# Patient Record
Sex: Male | Born: 1985 | Race: Black or African American | Hispanic: No | Marital: Married | Smoking: Never smoker
Health system: Southern US, Community
[De-identification: ages and names within clinical notes are randomized; demographics above are authoritative.]

## PROBLEM LIST (undated history)

## (undated) DIAGNOSIS — J302 Other seasonal allergic rhinitis: Secondary | ICD-10-CM

## (undated) DIAGNOSIS — J329 Chronic sinusitis, unspecified: Secondary | ICD-10-CM

## (undated) HISTORY — PX: ACNE CYST REMOVAL: SUR1112

---
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
## 2009-04-07 ENCOUNTER — Encounter: Admission: RE | Admit: 2009-04-07 | Discharge: 2009-04-07 | Payer: Self-pay | Admitting: Family Medicine

## 2009-04-19 ENCOUNTER — Emergency Department (HOSPITAL_COMMUNITY): Admission: EM | Admit: 2009-04-19 | Discharge: 2009-04-19 | Payer: Self-pay | Admitting: Family Medicine

## 2009-04-21 ENCOUNTER — Emergency Department (HOSPITAL_COMMUNITY)
Admission: EM | Admit: 2009-04-21 | Discharge: 2009-04-21 | Payer: Self-pay | Source: Home / Self Care | Admitting: Family Medicine

## 2009-12-25 ENCOUNTER — Emergency Department (HOSPITAL_COMMUNITY)
Admission: EM | Admit: 2009-12-25 | Discharge: 2009-12-25 | Payer: Self-pay | Source: Home / Self Care | Admitting: Emergency Medicine

## 2010-07-24 IMAGING — CR DG HAND COMPLETE 3+V*R*
3 series · 3 of 3 positions shown · non-contrast
Comparison: None.

CLINICAL DATA: Pain and swelling of the third and fourth
metacarpals after an injury.

RIGHT HAND - COMPLETE 3+ VIEW

[view not recorded (1 of 3)]
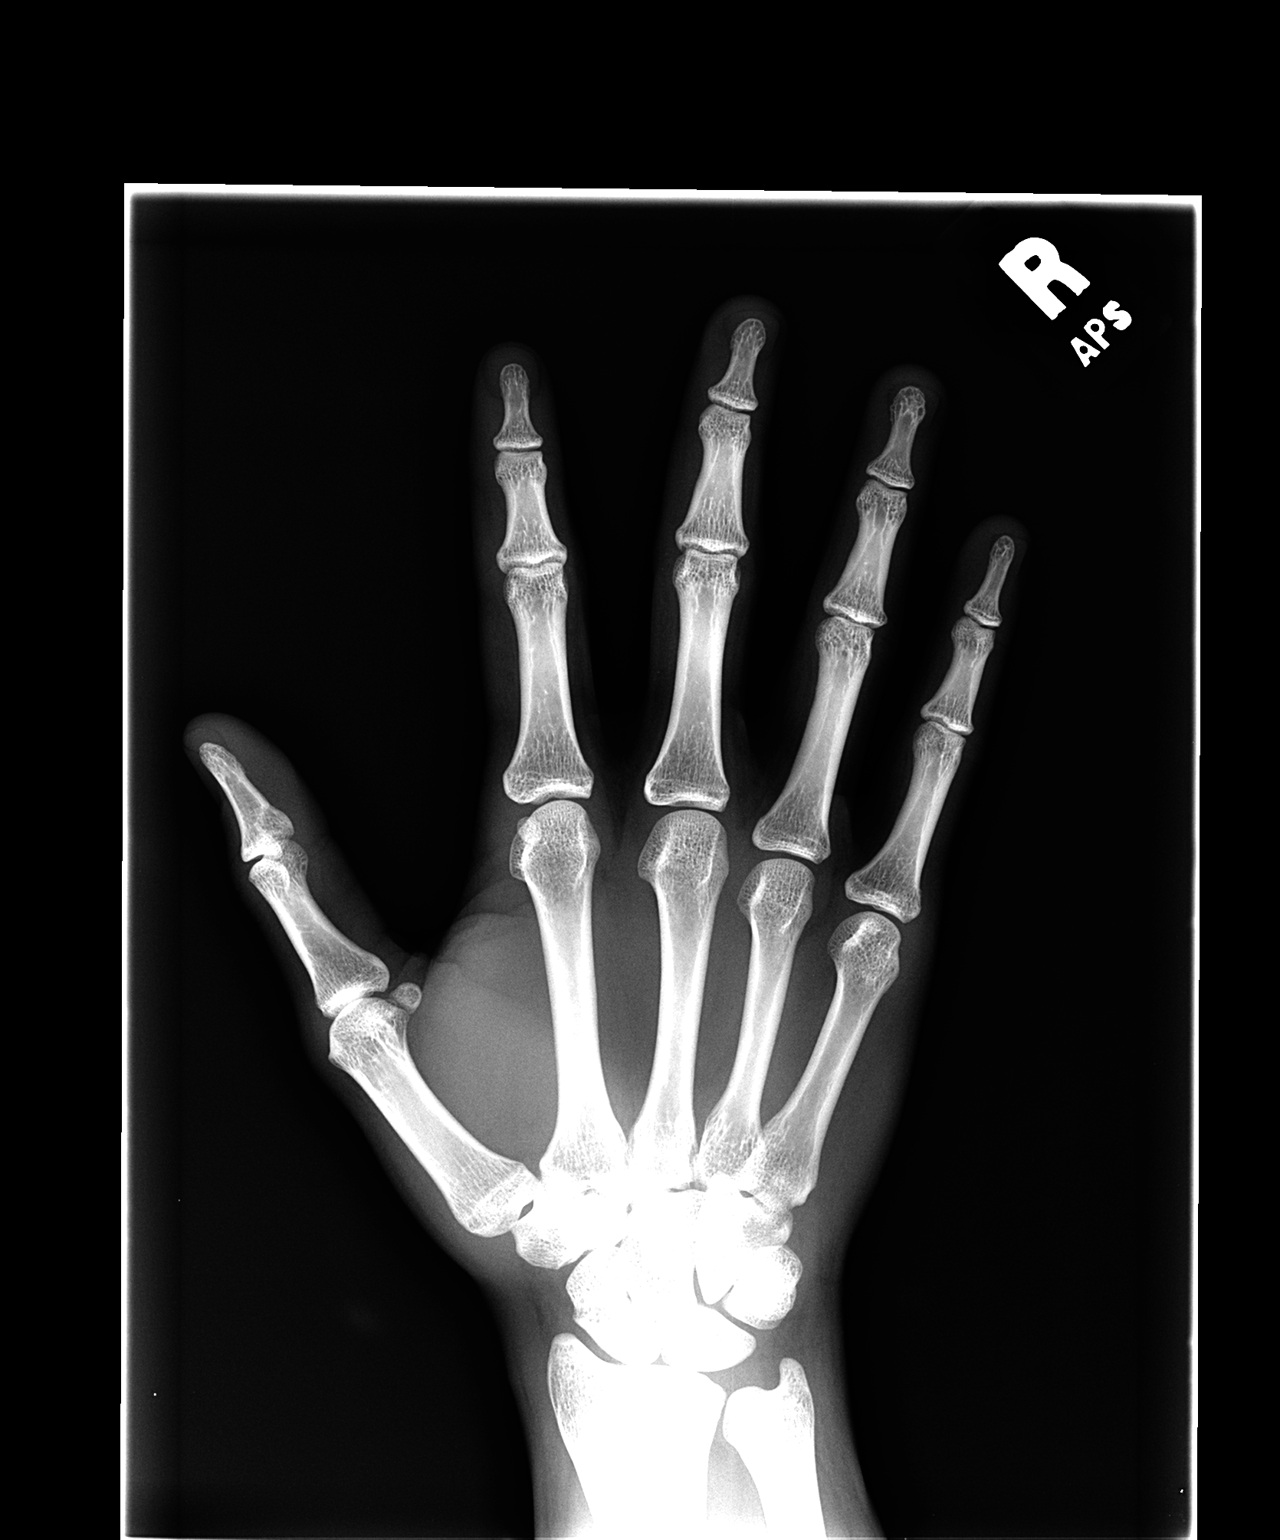

[view not recorded (2 of 3)]
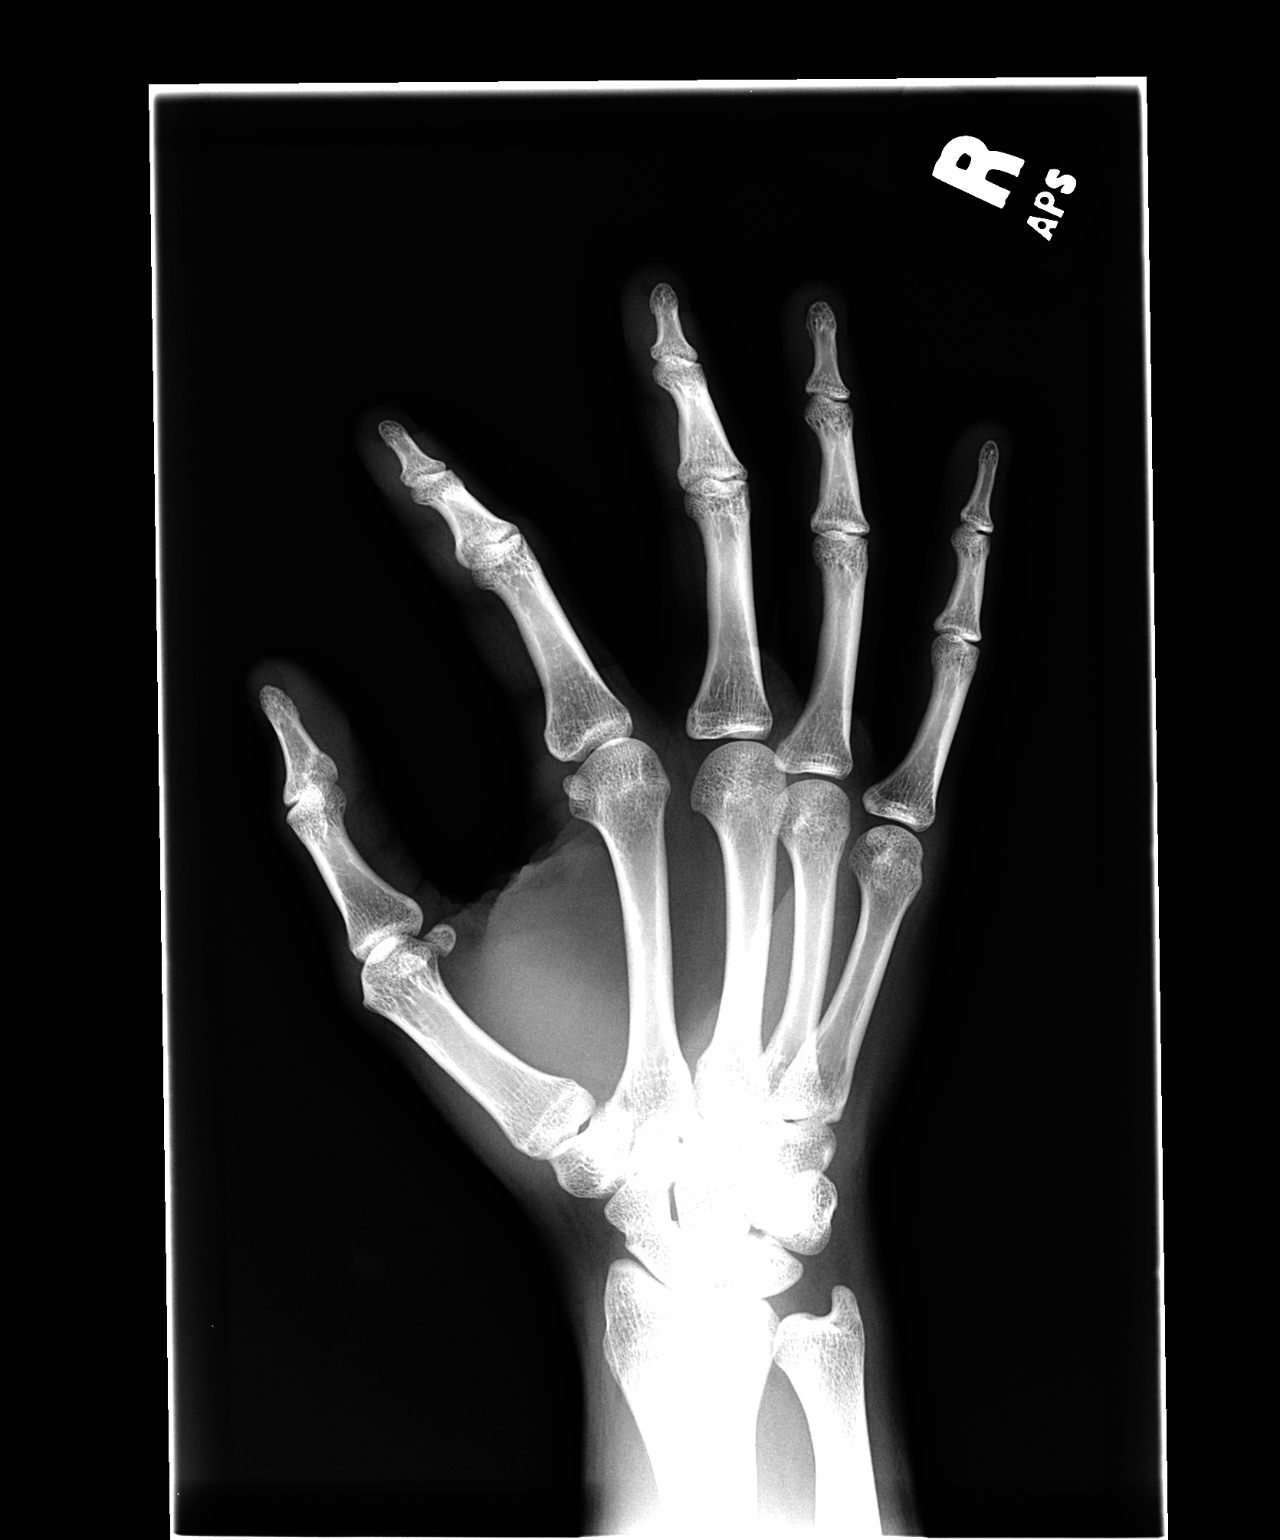

[view not recorded (3 of 3)]
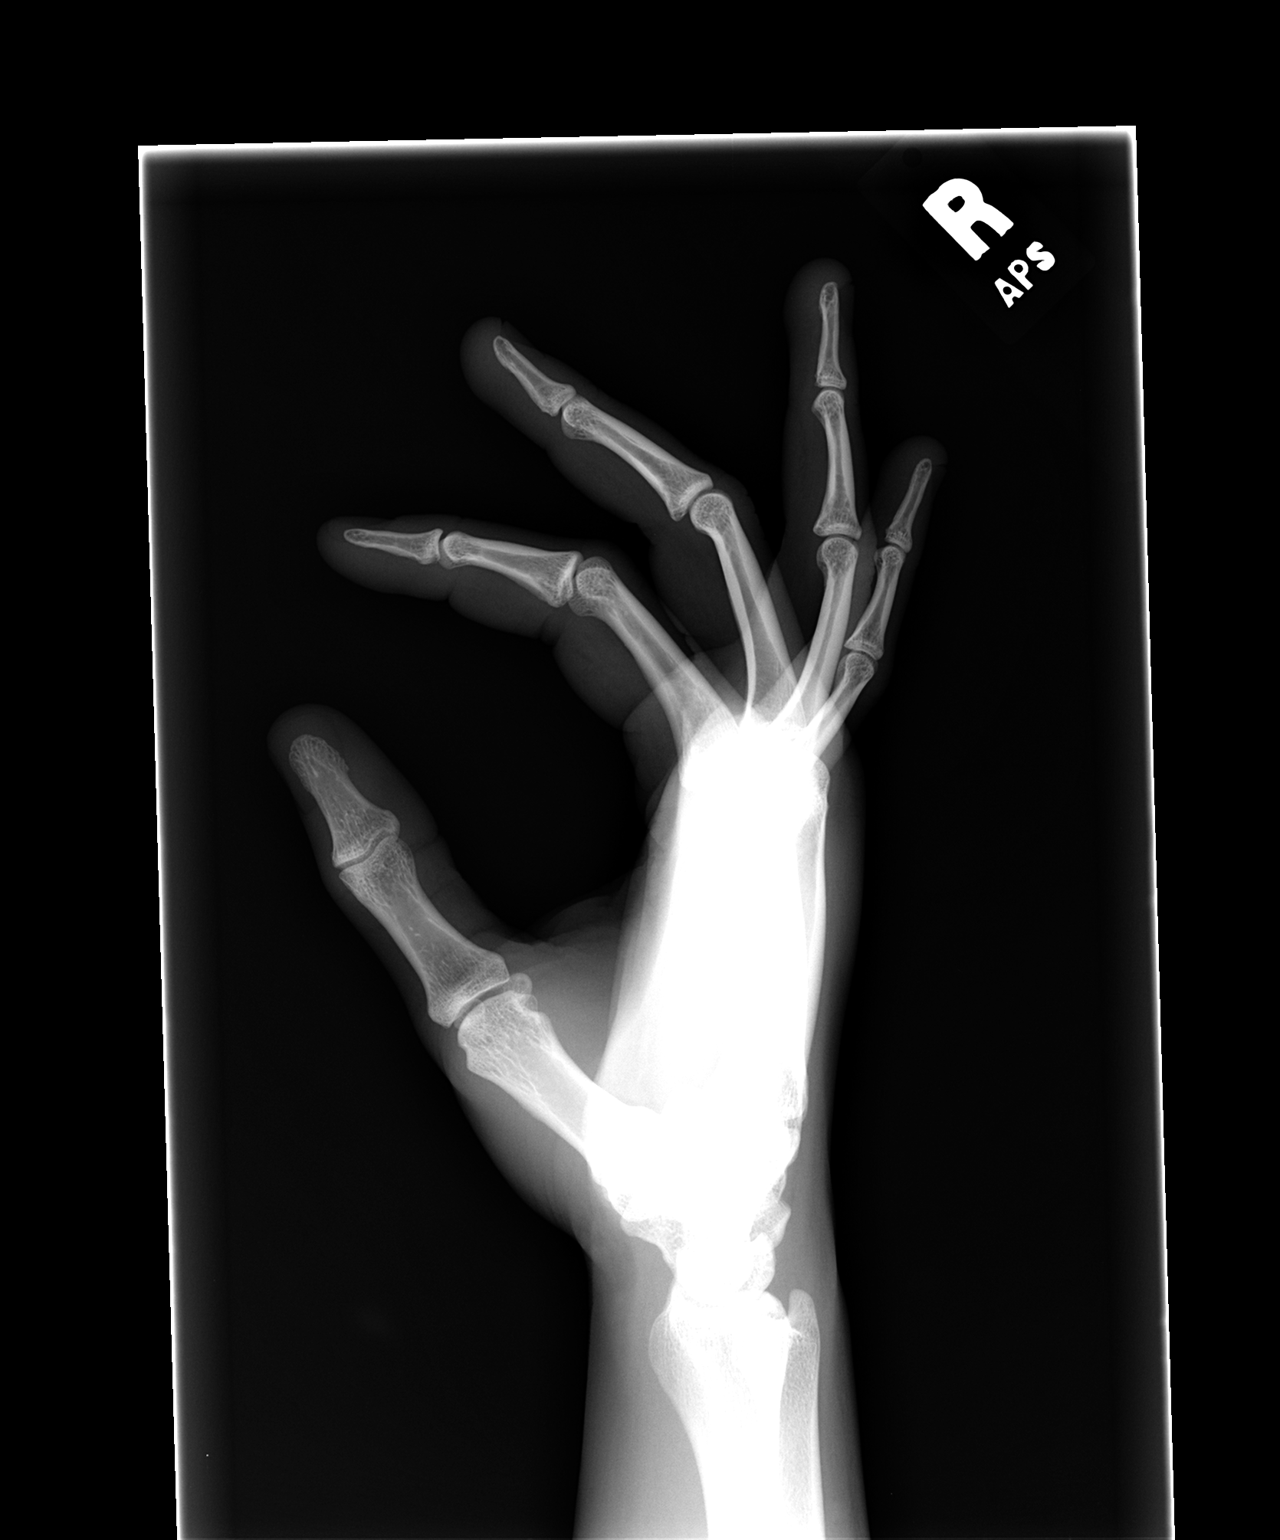

[3 of 3 positions shown; findings below may reference images not displayed]

FINDINGS: Dorsal soft tissue swelling without underlying fracture
or joint abnormality.
IMPRESSION: Dorsal soft tissue swelling without acute fracture or joint
abnormality.

## 2011-08-31 ENCOUNTER — Emergency Department (INDEPENDENT_AMBULATORY_CARE_PROVIDER_SITE_OTHER)
Admission: EM | Admit: 2011-08-31 | Discharge: 2011-08-31 | Disposition: A | Discharge status: Home / Self Care | Payer: Self-pay | Source: Home / Self Care | Attending: Emergency Medicine | Admitting: Emergency Medicine

## 2011-08-31 ENCOUNTER — Encounter (HOSPITAL_COMMUNITY): Payer: Self-pay | Admitting: *Deleted

## 2011-08-31 DIAGNOSIS — J029 Acute pharyngitis, unspecified: Secondary | ICD-10-CM

## 2011-08-31 HISTORY — DX: Other seasonal allergic rhinitis: J30.2

## 2011-08-31 HISTORY — DX: Chronic sinusitis, unspecified: J32.9

## 2011-08-31 LAB — POCT RAPID STREP A: Streptococcus, Group A Screen (Direct): NEGATIVE

## 2011-08-31 MED ORDER — FLUTICASONE PROPIONATE 50 MCG/ACT NA SUSP: 2.0000 | Freq: Every day | NASAL | Status: AC

## 2011-08-31 MED ORDER — IBUPROFEN 600 MG PO TABS: 600.0000 mg | ORAL_TABLET | Freq: Four times a day (QID) | ORAL | Status: AC | PRN

## 2011-08-31 NOTE — ED Notes (Signed)
Pt is here with complaints of sore throat X 5 days.  States he did have body aches and possibly a fever but these have resolved.  Bumps noted on tongue.

## 2011-08-31 NOTE — ED Provider Notes (Signed)
History     CSN: [id number]  Arrival date & time 08/31/11  1102   First MD Initiated Contact with Patient 08/31/11 1102      Chief Complaint  Patient presents with  . Sore Throat    (Consider location/radiation/quality/duration/timing/severity/associated sxs/prior treatment) HPI Comments: Pt with rhinorrhea, postnasal drip, ST x 4 days. States was worse this morning, but also states that he slept with his mouth open, as he was having difficulty breathing through his nose last night Reports feeling feverish and with bodyaches first day, but states that this has since resolved. No documented fevers at home. No coughing, fatigue, body aches. No ear pain, abd pain, wheeze, SOB, abd pain, rash, N/V. Appetite unchanged. Taking Alka-Seltzer cold medication with some relief.  No recent strep exposure, abdominal pain, rash reflux symptoms.   Patient is a 26 y.o. male presenting with pharyngitis. The history is provided by the patient. No language interpreter was used.  Sore Throat This is a new problem. The current episode started more than 2 days ago. The problem occurs constantly. The problem has not changed since onset.Pertinent negatives include no abdominal pain and no headaches. The symptoms are aggravated by swallowing. The symptoms are relieved by medications. Treatments tried: Alka-Seltzer cold. The treatment provided mild relief.    Past Medical History  Diagnosis Date  . Sinusitis   . Seasonal allergies     History reviewed. No pertinent past surgical history.  History reviewed. No pertinent family history.  History  Substance Use Topics  . Smoking status: Never Smoker   . Smokeless tobacco: Not on file  . Alcohol Use: Yes      Review of Systems  Gastrointestinal: Negative for abdominal pain.  Neurological: Negative for headaches.    Allergies  Hydrocodone  Home Medications   Current Outpatient Rx  Name Route Sig Dispense Refill  . FLUTICASONE PROPIONATE 50  MCG/ACT NA SUSP Nasal Place 2 sprays into the nose daily. 16 g 0  . IBUPROFEN 600 MG PO TABS Oral Take 1 tablet (600 mg total) by mouth every 6 (six) hours as needed for pain. 30 tablet 0    BP 151/66  Pulse 78  Temp 98.2 F (36.8 C) (Oral)  Resp 14  SpO2 96%  Physical Exam  Nursing note and vitals reviewed. Constitutional: He is oriented to person, place, and time. He appears well-developed and well-nourished.  HENT:  Head: Normocephalic and atraumatic.  Right Ear: Hearing, tympanic membrane and ear canal normal.  Left Ear: Hearing, tympanic membrane and ear canal normal.  Nose: Rhinorrhea present. No epistaxis.  Mouth/Throat: Uvula is midline and mucous membranes are normal. Posterior oropharyngeal erythema present. No oropharyngeal exudate.       Cobblestoned oropharynx. Tonsils normal No sinus tenderness  Eyes: Conjunctivae and EOM are normal.  Neck: Normal range of motion. Neck supple.  Cardiovascular: Normal rate, regular rhythm and normal heart sounds.   Pulmonary/Chest: Effort normal and breath sounds normal. No respiratory distress.  Abdominal: Soft. Bowel sounds are normal. He exhibits no distension. There is no tenderness.  Musculoskeletal: Normal range of motion. He exhibits no edema and no tenderness.  Lymphadenopathy:    He has no cervical adenopathy.  Neurological: He is alert and oriented to person, place, and time.  Skin: Skin is warm and dry. No rash noted.  Psychiatric: He has a normal mood and affect. His behavior is normal. Judgment and thought content normal.    ED Course  Procedures (including critical care time)  Labs Reviewed  POCT RAPID STREP A ([facility] ONLY)   No results found.   1. Pharyngitis     Rapid strep negative  MDM  H&P most consistent with post uri ST/ postnasal drip will send home with Flonase, saline nasal irrigation, decongestions, ibuprofen as needed for symptoms. Will provide a list of primary care resources for followup  as needed. Patient agrees with plan.  Luiz Blare, MD 08/31/11 1229

## 2011-08-31 NOTE — Discharge Instructions (Signed)
Your strep test was negative. The symptoms are most likely from postnasal drip . Start doing saline nasal irrigation with no med sinus rinse, Neti pot, and Flonase, and started decongestants such as Sudafed with nasal congestion. Your sore throat should improve after this. You may take ibuprofen 600 mg and Tylenol 1 g up to 4 times a day as needed for pain. Go to www.goodrx.com to look up your medications. This will give you a list of where you can find your prescriptions at the most affordable prices.   Call [facility]  [phone]  If you have no primary doctor, here are some resources that may be helpful:  Medicaid-accepting [facility] Providers:   - [facility]- [address]      [phone]      Mon-Fri 9am-7pm, Sat 9am-1pm   - [facility]- [address] [place]      [phone]   - [facility]- [address] [place]      [phone]   [facility]- [address]      [phone]   - Renaye Rakers- [address] [place], [address]      [phone]      Only accepts [place] patients       after they have her name applied to their card   Self Pay (no insurance) in [place]:   - Sickle Cell Patients: Dr Willey Blade, [facility]      [address] [place]      [phone]   - [phone]   - Physician Referral Service- [phone]   - [facility]- [address] [place]      [phone]   [facility]- [address]   - [facility]- see information above      (Speak to [employer] if you do not have insurance)   - [facility]- [address] [place]      [phone]   - [facility] [place]- [address]      [phone]   - [facility]- [address]      [phone]   - Dr Julio Sicks-  [address], [place]      [phone]   - [facility]- [address]      [phone]   - [facility]- [address]      [phone]      Also [address]      [phone]   - [facility]- [address]      [phone]      1st and 3rd Saturday every month, 10am-1pm    Other agencies that provide inexpensive medical care:     [facility]  [phone]    [facility]  [phone]    [facility]  [phone]    [facility]  [phone] [address] [place] [address]    [facility]  [phone]    [facility]  [phone] [facility] [phone]   [address] [place], [place] [address]  Chronic Pain Problems Contact [facility]  [phone] Patients need to be referred by their primary care doctor.  [employer]  [employer]  [facility]     [employer]                          [facility] [address] [place]                       [address]      [address] [place] [address]   [phone] (After Hours)  General Information: Finding a doctor when you do not have health insurance can be tricky. Although you are not limited by an insurance plan, you are of course limited by her finances and how much but he can pay out of pocket.  What are your options if you don't have health insurance?   1) Find a Librarian, academic and Pay Out of Pocket Although you won't have to find out who is covered by your insurance plan, it is a good idea to ask around and get recommendations. You will then need to call the office and see if the doctor you have chosen will accept you as a new patient and what types of options they offer for patients who are self-pay. Some doctors offer discounts or will set up payment plans for their patients who do not have insurance, but you will need to ask so you aren't surprised when you get to your appointment.  2) Contact Your Local  Health Department Not all health departments have doctors that can see patients for sick visits, but many do, so it is worth a call to see if yours does. If you don't know where your local health department is, you can check in your phone book. The CDC also has a tool to help you locate your state's health department, and many state websites also have listings of all of their local health departments.  3) Find a Walk-in Clinic If your illness is not likely to be very severe or complicated, you may want to try a walk in clinic. These are popping up all over the country in pharmacies, drugstores, and shopping centers. They're usually staffed by nurse practitioners or physician assistants that have been trained to treat common illnesses and complaints. They're usually fairly quick and inexpensive. However, if you have serious medical issues or chronic medical problems, these are probably not your best option

## 2011-08-31 NOTE — ED Notes (Signed)
Unable to discharge pt due to pending strep screen.

## 2015-04-25 ENCOUNTER — Encounter: Payer: Self-pay | Admitting: Gynecology

## 2015-04-25 ENCOUNTER — Ambulatory Visit
Admission: EM | Admit: 2015-04-25 | Discharge: 2015-04-25 | Disposition: A | Discharge status: Home / Self Care | Payer: Managed Care, Other (non HMO) | Attending: Family Medicine | Admitting: Family Medicine

## 2015-04-25 DIAGNOSIS — J0100 Acute maxillary sinusitis, unspecified: Secondary | ICD-10-CM

## 2015-04-25 LAB — RAPID STREP SCREEN (MED CTR MEBANE ONLY): Streptococcus, Group A Screen (Direct): NEGATIVE

## 2015-04-25 MED ORDER — AMOXICILLIN 875 MG PO TABS: 875.0000 mg | ORAL_TABLET | Freq: Two times a day (BID) | ORAL | Status: DC

## 2015-04-25 NOTE — ED Provider Notes (Signed)
CSN: [id number]     Arrival date & time 04/25/15  6644 History   First MD Initiated Contact with Patient 04/25/15 1033     Chief Complaint  Patient presents with  . Sore Throat  . URI   (Consider location/radiation/quality/duration/timing/severity/associated sxs/prior Treatment) Patient is a 30 y.o. male presenting with URI. The history is provided by the patient.  URI Presenting symptoms: congestion, cough, facial pain, fatigue, fever, rhinorrhea and sore throat   Severity:  Moderate Onset quality:  Sudden Timing:  Constant Chronicity:  New Relieved by:  Nothing Ineffective treatments:  OTC medications Associated symptoms: headaches and sinus pain   Associated symptoms: no neck pain and no wheezing   Risk factors: sick contacts   Risk factors: not elderly, no chronic cardiac disease, no chronic kidney disease, no chronic respiratory disease, no diabetes mellitus, no immunosuppression, no recent illness and no recent travel     Past Medical History  Diagnosis Date  . Sinusitis   . Seasonal allergies    History reviewed. No pertinent past surgical history. No family history on file. Social History  Substance Use Topics  . Smoking status: Never Smoker   . Smokeless tobacco: None  . Alcohol Use: Yes    Review of Systems  Constitutional: Positive for fever and fatigue.  HENT: Positive for congestion, rhinorrhea and sore throat.   Respiratory: Positive for cough. Negative for wheezing.   Musculoskeletal: Negative for neck pain.  Neurological: Positive for headaches.    Allergies  Hydrocodone  Home Medications   Prior to Admission medications   Medication Sig Start Date End Date Taking? Authorizing Provider  amoxicillin (AMOXIL) 875 MG tablet Take 1 tablet (875 mg total) by mouth 2 (two) times daily. 04/25/15   Payton Mccallum, MD  fluticasone (FLONASE) 50 MCG/ACT nasal spray Place 2 sprays into the nose daily. 08/31/11 08/30/12  Domenick Gong, MD   Meds Ordered and  Administered this Visit  Medications - No data to display  BP 140/81 mmHg  Pulse 64  Temp(Src) 97.8 F (36.6 C) (Oral)  Resp 16  Ht  (1.88 m)  Wt 260 lb (117.935 kg)  BMI 33.37 kg/m2  SpO2 100% No data found.   Physical Exam  Constitutional: He appears well-developed and well-nourished. No distress.  HENT:  Head: Normocephalic and atraumatic.  Right Ear: Tympanic membrane, external ear and ear canal normal.  Left Ear: Tympanic membrane, external ear and ear canal normal.  Nose: Right sinus exhibits maxillary sinus tenderness and frontal sinus tenderness. Left sinus exhibits maxillary sinus tenderness and frontal sinus tenderness.  Mouth/Throat: Uvula is midline, oropharynx is clear and moist and mucous membranes are normal. No oropharyngeal exudate or tonsillar abscesses.  Eyes: Conjunctivae and EOM are normal. Pupils are equal, round, and reactive to light. Right eye exhibits no discharge. Left eye exhibits no discharge. No scleral icterus.  Neck: Normal range of motion. Neck supple. No tracheal deviation present. No thyromegaly present.  Cardiovascular: Normal rate, regular rhythm and normal heart sounds.   Pulmonary/Chest: Effort normal and breath sounds normal. No stridor. No respiratory distress. He has no wheezes. He has no rales. He exhibits no tenderness.  Lymphadenopathy:    He has no cervical adenopathy.  Neurological: He is alert.  Skin: Skin is warm and dry. No rash noted. He is not diaphoretic.  Nursing note and vitals reviewed.   ED Course  Procedures (including critical care time)  Labs Review Labs Reviewed  RAPID STREP SCREEN (NOT AT [facility])  CULTURE,  GROUP A STREP [facility])    Imaging Review No results found.   Visual Acuity Review  Right Eye Distance:   Left Eye Distance:   Bilateral Distance:    Right Eye Near:   Left Eye Near:    Bilateral Near:         MDM   1. Acute maxillary sinusitis, recurrence not specified    Discharge  Medication List as of 04/25/2015 10:46 AM    START taking these medications   Details  amoxicillin (AMOXIL) 875 MG tablet Take 1 tablet (875 mg total) by mouth 2 (two) times daily., Starting 04/25/2015, Until Discontinued, Normal       1. diagnosis reviewed with patient 2. rx as per orders above; reviewed possible side effects, interactions, risks and benefits  3. Recommend supportive treatment with otc flonase 4. Follow-up prn if symptoms worsen or don't improve     Payton Mccallum, MD 04/25/15 1058

## 2015-04-25 NOTE — ED Notes (Signed)
Patient c/o sore throat / head aces / sinus pressure / congestion x 1 week.

## 2015-04-30 LAB — CULTURE, GROUP A STREP (THRC)

## 2015-06-02 ENCOUNTER — Encounter: Payer: Self-pay | Admitting: Emergency Medicine

## 2015-06-02 ENCOUNTER — Ambulatory Visit
Admission: EM | Admit: 2015-06-02 | Discharge: 2015-06-02 | Disposition: A | Discharge status: Home / Self Care | Payer: Managed Care, Other (non HMO) | Attending: Family Medicine | Admitting: Family Medicine

## 2015-06-02 DIAGNOSIS — S29011A Strain of muscle and tendon of front wall of thorax, initial encounter: Secondary | ICD-10-CM | POA: Diagnosis not present

## 2015-06-02 DIAGNOSIS — H6593 Unspecified nonsuppurative otitis media, bilateral: Secondary | ICD-10-CM | POA: Diagnosis not present

## 2015-06-02 DIAGNOSIS — S46911A Strain of unspecified muscle, fascia and tendon at shoulder and upper arm level, right arm, initial encounter: Secondary | ICD-10-CM | POA: Diagnosis not present

## 2015-06-02 DIAGNOSIS — J0101 Acute recurrent maxillary sinusitis: Secondary | ICD-10-CM

## 2015-06-02 MED ORDER — SALINE SPRAY 0.65 % NA SOLN: 2.0000 | NASAL | Status: DC

## 2015-06-02 MED ORDER — AMOXICILLIN-POT CLAVULANATE 875-125 MG PO TABS: 1.0000 | ORAL_TABLET | Freq: Two times a day (BID) | ORAL | Status: DC

## 2015-06-02 MED ORDER — LORATADINE 10 MG PO TBDP: 10.0000 mg | ORAL_TABLET | Freq: Every day | ORAL | Status: AC

## 2015-06-02 MED ORDER — NAPROXEN 500 MG PO TABS
500.0000 mg | ORAL_TABLET | Freq: Two times a day (BID) | ORAL | Status: DC
Start: 2015-06-02 — End: 2017-01-08

## 2015-06-02 NOTE — ED Provider Notes (Signed)
CSN: [id number]     Arrival date & time 06/02/15  1516 History   First MD Initiated Contact with Patient 06/02/15 1641     Chief Complaint  Patient presents with  . 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  Had sinus infection treated with augmentin in the past month resolved but feeling some sinus congestion again.  Right hand dominant.  New father carrying car seat with child up three flights of stairs daily.  Denied known injury/trauma or previous injury to shoulder.  PMHx seasonal allergies  Denied PSHx  FHx Grandfather prostate cancer  The history is provided by the patient.    Past Medical History  Diagnosis Date  . Sinusitis   . Seasonal allergies    Past Surgical History  Procedure Laterality Date  . Acne cyst removal      from jaw   No family history on file. Social History  Substance Use Topics  . Smoking status: Never Smoker   . Smokeless tobacco: None  . Alcohol Use: Yes    Review of Systems  Constitutional: Negative for fever, chills, diaphoresis, activity change, appetite change, fatigue and unexpected weight change.  HENT: Positive for congestion, postnasal drip and sinus pressure. Negative for dental problem, drooling, ear discharge, ear pain, facial swelling, hearing loss, mouth sores, nosebleeds, rhinorrhea, sneezing, sore throat, tinnitus, trouble swallowing and voice change.   Eyes: Negative for photophobia, pain, discharge, redness, itching and visual disturbance.  Respiratory: Negative for cough, choking, chest tightness, shortness of breath, wheezing and stridor.   Cardiovascular: Negative for chest pain, palpitations and leg swelling.  Gastrointestinal: Negative for nausea, vomiting, abdominal pain, diarrhea, constipation,  blood in stool and abdominal distention.  Endocrine: Negative for cold intolerance and heat intolerance.  Genitourinary: Negative for dysuria.  Musculoskeletal: Positive for myalgias. Negative for back pain, joint swelling, arthralgias, gait problem, neck pain and neck stiffness.  Skin: Negative for color change, pallor, rash and wound.  Allergic/Immunologic: Positive for environmental allergies. Negative for food allergies and immunocompromised state.  Neurological: Positive for headaches. Negative for dizziness, tremors, seizures, syncope, facial asymmetry, speech difficulty, weakness, light-headedness and numbness.  Hematological: Negative for adenopathy. Does not bruise/bleed easily.  Psychiatric/Behavioral: Negative for behavioral problems, confusion, sleep disturbance and agitation.    Allergies  Hydrocodone  Home Medications   Prior to Admission medications   Medication Sig Start Date End Date Taking? Authorizing Provider  amoxicillin-clavulanate (AUGMENTIN) 875-125 MG tablet Take 1 tablet by mouth every 12 (twelve) hours. 06/02/15   Barbaraann Barthel, NP  fluticasone (FLONASE) 50 MCG/ACT nasal spray Place 2 sprays into the nose daily. 08/31/11 08/30/12  Domenick Gong, MD  loratadine (CLARITIN REDITABS) 10 MG dissolvable tablet Take 1 tablet (10 mg total) by mouth daily. 06/02/15   Barbaraann Barthel, NP  naproxen (NAPROSYN) 500 MG tablet Take 1 tablet (500 mg total) by mouth 2 (two) times daily with a meal. 06/02/15   Barbaraann Barthel, NP  sodium chloride (OCEAN) 0.65 % SOLN nasal spray Place 2 sprays into both nostrils every 2 (two) hours while awake. 06/02/15 07/03/15  Barbaraann Barthel, NP   Meds Ordered and Administered this Visit  Medications - No data to display  BP 140/69 mmHg  Pulse 72  Temp(Src) 98 F (36.7 C) (Tympanic)  Resp 18  Ht 6\' 2"  (1.88 m)  Wt  260 lb (117.935 kg)  BMI 33.37 kg/m2  SpO2 100% No data found.   Physical Exam  Constitutional: He is oriented to  person, place, and time. Vital signs are normal. He appears well-developed and well-nourished. He is active and cooperative.  Non-toxic appearance. He does not have a sickly appearance. He does not appear ill. No distress.  HENT:  Head: Normocephalic and atraumatic.  Right Ear: Hearing, external ear and ear canal normal. A middle ear effusion is present.  Left Ear: Hearing, external ear and ear canal normal. A middle ear effusion is present.  Nose: Mucosal edema and rhinorrhea present. No nose lacerations, sinus tenderness, nasal deformity, septal deviation or nasal septal hematoma. No epistaxis.  No foreign bodies. Right sinus exhibits maxillary sinus tenderness. Right sinus exhibits no frontal sinus tenderness. Left sinus exhibits maxillary sinus tenderness. Left sinus exhibits no frontal sinus tenderness.  Mouth/Throat: Uvula is midline and mucous membranes are normal. Mucous membranes are not pale, not dry and not cyanotic. He does not have dentures. No oral lesions. No trismus in the jaw. Normal dentition. No dental abscesses, uvula swelling, lacerations or dental caries. Posterior oropharyngeal edema and posterior oropharyngeal erythema present. No oropharyngeal exudate or tonsillar abscesses.  Cobblestoning posterior pharynx; bilateral TMs with air fluid level slight opacity vasculature inflamed; bilateral nasal turbinates with edema/erythema clear discharge  Eyes: EOM and lids are normal. Pupils are equal, round, and reactive to light. Right eye exhibits no chemosis, no discharge, no exudate and no hordeolum. No foreign body present in the right eye. Left eye exhibits no chemosis, no discharge, no exudate and no hordeolum. No foreign body present in the left eye. Right conjunctiva is injected. Right conjunctiva has no hemorrhage. Left conjunctiva is injected. Left conjunctiva has no hemorrhage. No scleral icterus. Right eye exhibits normal extraocular motion and no nystagmus. Left eye exhibits normal  extraocular motion and no nystagmus. Right pupil is round and reactive. Left pupil is round and reactive. Pupils are equal.  1+ bilateral bulbar and eyelid injection  Neck: Trachea normal and normal range of motion. Neck supple. No tracheal tenderness, no spinous process tenderness and no muscular tenderness present. No rigidity. No tracheal deviation, no edema, no erythema and normal range of motion present. No thyroid mass and no thyromegaly present.  Cardiovascular: Normal rate, regular rhythm, S1 normal, S2 normal, normal heart sounds and intact distal pulses.  PMI is not displaced.  Exam reveals no gallop and no friction rub.   No murmur heard. Pulses:      Radial pulses are 2+ on the right side, and 2+ on the left side.  Pulmonary/Chest: Effort normal and breath sounds normal. No stridor. No respiratory distress. He has no decreased breath sounds. He has no wheezes. He has no rhonchi. He has no rales. Chest wall is not dull to percussion. He exhibits tenderness. He exhibits no mass, no bony tenderness, no laceration, no crepitus, no edema, no deformity, no swelling and no retraction.    Abdominal: Soft. He exhibits no distension.  Musculoskeletal: Normal range of motion. He exhibits tenderness. He exhibits no edema.       Right shoulder: He exhibits pain. He exhibits normal range of motion, no tenderness, no bony tenderness, no swelling, no effusion, no crepitus, no deformity, no laceration, no spasm, normal pulse and normal strength.       Left shoulder: Normal.       Right elbow: Normal.      Left elbow: Normal.  Right wrist: Normal.       Left wrist: Normal.       Right hip: Normal.       Left hip: Normal.       Right knee: Normal.       Left knee: Normal.       Cervical back: Normal.       Thoracic back: He exhibits tenderness and pain. He exhibits normal range of motion, no bony tenderness, no swelling, no edema, no deformity, no laceration, no spasm and normal pulse.        Lumbar back: Normal.       Left hand: Normal.  Right shoulder scapular muscle pain with external rotation; negative empty can, atchley scratch, full AROM abduction/adduction, internal rotation  Bilateral arm strength 5/5 equal hand grasp  Lymphadenopathy:       Head (right side): No submental, no submandibular, no tonsillar, no preauricular, no posterior auricular and no occipital adenopathy present.       Head (left side): No submental, no submandibular, no tonsillar, no preauricular, no posterior auricular and no occipital adenopathy present.    He has no cervical adenopathy.       Right cervical: No superficial cervical, no deep cervical and no posterior cervical adenopathy present.      Left cervical: No superficial cervical, no deep cervical and no posterior cervical adenopathy present.  Neurological: He is alert and oriented to person, place, and time. He displays no atrophy and no tremor. No cranial nerve deficit or sensory deficit. He exhibits normal muscle tone. He displays no seizure activity. Coordination and gait normal. GCS eye subscore is 4. GCS verbal subscore is 5. GCS motor subscore is 6.  Skin: Skin is warm, dry and intact. No abrasion, no bruising, no burn, no ecchymosis, no laceration, no lesion, no petechiae and no rash noted. He is not diaphoretic. No cyanosis or erythema. No pallor. Nails show no clubbing.  Psychiatric: He has a normal mood and affect. His speech is normal and behavior is normal. Judgment and thought content normal. Cognition and memory are normal.  Nursing note and vitals reviewed.   ED Course  Procedures (including critical care time)  Labs Review Labs Reviewed - No data to display  Imaging Review No results found.   MDM   1. Muscle strain of scapular region, right, initial encounter   2. Chest wall muscle strain, initial encounter   3. Acute recurrent maxillary sinusitis   4. Otitis media with effusion, bilateral    Patient did not require  work excuse.  Discussed avoid heaving lifting for next week or starting new weight lifting exercise program this week.  Gentle AROM stretching demonstrated pectoral stretch/wall spiders, back release, shoulder circles.  Carry car seat/child in other arm to allow right hand dominant time to heal.  Patient was instructed to rest, ice, and ROM exercises.  Activity as tolerated. Trial naproxen  po BID x 2 weeks.  Cryotherapy or heat therapy whichever helps the most typically muscle spasms respond to heat and inflammation to ice.  Exitcare handout on shoulder exercises, muscle strain given to patient.  Follow up if symptoms persist or worsen then consider PT/orthopedics referrals if fails conservative treatment.  Patient verbalized agreement and understanding of treatment plan.  P2:  Injury Prevention and Fitness.  Supportive treatment.   No evidence of invasive bacterial infection, non toxic and well hydrated.  This is most likely self limiting viral infection.  I do not see where any further  testing or imaging is necessary at this time.   I will suggest supportive care, rest, good hygiene and encourage the patient to take adequate fluids.  The patient is to return to clinic or EMERGENCY ROOM if symptoms worsen or change significantly e.g. ear pain, fever, purulent discharge from ears or bleeding.  Exitcare handout on otitis media with effusion given to patient.  Patient verbalized agreement and understanding of treatment plan.    Patient notified rapid strep negative.  Suspect Viral illness: no evidence of invasive bacterial infection, non toxic and well hydrated.  This is most likely self limiting viral infection.  I do not see where any further testing or imaging is necessary at this time.   I will suggest supportive care, rest, good hygiene and encourage the patient to take adequate fluids.  Does not require work excuse.  claritin 10mg  po daily, Sudafed 30mg  po q4-6h prn rhinitis max 240mg  per 24 hours  avoid driving and alcohol; flonase 1 spray each nostril BID prn, nasal saline 1-2 sprays each nostril prn q2h, Discussed honey with lemon and salt water gargles for comfort also.  The patient is to return to clinic or EMERGENCY ROOM if symptoms worsen or change significantly e.g. fever, lethargy, SOB, wheezing.  Exitcare handout on viral illness given to patient.  Patient verbalized agreement and understanding of treatment plan.    start flonase 1 spray each nostril BID, saline 2 sprays each nostril q2h prn congestion.  If no improvement with 48 hours of saline and flonase use start augmentin 875mg  po BID x 10 days.  Rx given.  No evidence of systemic bacterial infection, non toxic and well hydrated.  I do not see where any further testing or imaging is necessary at this time.   I will suggest supportive care, rest, good hygiene and encourage the patient to take adequate fluids.  The patient is to return to clinic or EMERGENCY ROOM if symptoms worsen or change significantly.  Exitcare handout on sinusitis given to patient.  Patient verbalized agreement and understanding of treatment plan and had no further questions at this time.   P2:  Hand washing and cover cough  Suspect related to post nasal drip from recurrent sinusitis/allergic rhinitis.  Bronchitis simple, community acquired, may have started as viral (probably respiratory syncytial, parainfluenza, influenza, or adenovirus), but now evidence of acute purulent bronchitis with resultant bronchial edema and mucus formation.  Viruses are the most common cause of bronchial inflammation in otherwise healthy adults with acute bronchitis.  The appearance of sputum is not predictive of whether a bacterial infection is present.  Purulent sputum is most often caused by viral infections.  There are a small portion of those caused by non-viral agents being Mycoplamsa pneumonia.  Microscopic examination or C&S of sputum in the healthy adult with acute bronchitis is  generally not helpful (usually negative or normal respiratory flora) other considerations being cough from upper respiratory tract infections, sinusitis or allergic syndromes (mild asthma or viral pneumonia).  Differential Diagnosis:  reactive airway disease (asthma, allergic aspergillosis (eosinophilia), chronic bronchitis, respiratory infection (Sinusitis, Common cold, pneumonia), congestive heart failure, reflux esophagitis, bronchogenic tumor, aspiration syndromes and/or exposure irritants/tobacco smoke.  In this case, there is no evidence of any invasive bacterial illness.  Most likely viral etiology so will hold on antibiotic treatment.  Advise supportive care with rest, encourage fluids, good hygiene and watch for any worsening symptoms.  If they were to develop:  come back to the office or go to the  emergency room if after hours. Without high fever, severe dyspnea, lack of physical findings or other risk factors, I will hold on a chest radiograph and CBC at this time.  I discussed that approximately 50% of patients with acute bronchitis have a cough that lasts up to three weeks, and 25% for over a month.  Tylenol, one to two tablets every four hours as needed for fever or myalgias and or naproxen  po BID as prescribed for muscle strain.   No aspirin.  Patient instructed to follow up in one week or sooner if symptoms worsen. Patient verbalized agreement and understanding of treatment plan and had no further questions at this time.  P2:  hand washing and cover cough    Barbaraann Barthel, NP 06/02/15 2258

## 2015-06-02 NOTE — Discharge Instructions (Signed)
Cryotherapy °Cryotherapy means treatment with cold. Ice or gel packs can be used to reduce both pain and swelling. Ice is the most helpful within the first 24 to 48 hours after an injury or flare-up from overusing a muscle or joint. Sprains, strains, spasms, burning pain, shooting pain, and aches can all be eased with ice. Ice can also be used when recovering from surgery. Ice is effective, has very few side effects, and is safe for most people to use. °PRECAUTIONS  °Ice is not a safe treatment option for people with: °· Raynaud phenomenon. This is a condition affecting small blood vessels in the extremities. Exposure to cold may cause your problems to return. °· Cold hypersensitivity. There are many forms of cold hypersensitivity, including: °· Cold urticaria. Red, itchy hives appear on the skin when the tissues begin to warm after being iced. °· Cold erythema. This is a red, itchy rash caused by exposure to cold. °· Cold hemoglobinuria. Red blood cells break down when the tissues begin to warm after being iced. The hemoglobin that carry oxygen are passed into the urine because they cannot combine with blood proteins fast enough. °· Numbness or altered sensitivity in the area being iced. °If you have any of the following conditions, do not use ice until you have discussed cryotherapy with your caregiver: °· Heart conditions, such as arrhythmia, angina, or chronic heart disease. °· High blood pressure. °· Healing wounds or open skin in the area being iced. °· Current infections. °· Rheumatoid arthritis. °· Poor circulation. °· Diabetes. °Ice slows the blood flow in the region it is applied. This is beneficial when trying to stop inflamed tissues from spreading irritating chemicals to surrounding tissues. However, if you expose your skin to cold temperatures for too long or without the proper protection, you can damage your skin or nerves. Watch for signs of skin damage due to cold. °HOME CARE INSTRUCTIONS °Follow  these tips to use ice and cold packs safely. °· Place a dry or damp towel between the ice and skin. A damp towel will cool the skin more quickly, so you may need to shorten the time that the ice is used. °· For a more rapid response, add gentle compression to the ice. °· Ice for no more than 10 to 20 minutes at a time. The bonier the area you are icing, the less time it will take to get the benefits of ice. °· Check your skin after 5 minutes to make sure there are no signs of a poor response to cold or skin damage. °· Rest 20 minutes or more between uses. °· Once your skin is numb, you can end your treatment. You can test numbness by very lightly touching your skin. The touch should be so light that you do not see the skin dimple from the pressure of your fingertip. When using ice, most people will feel these normal sensations in this order: cold, burning, aching, and numbness. °· Do not use ice on someone who cannot communicate their responses to pain, such as small children or people with dementia. °HOW TO MAKE AN ICE PACK °Ice packs are the most common way to use ice therapy. Other methods include ice massage, ice baths, and cryosprays. Muscle creams that cause a cold, tingly feeling do not offer the same benefits that ice offers and should not be used as a substitute unless recommended by your caregiver. °To make an ice pack, do one of the following: °· Place crushed ice or a   bag of frozen vegetables in a sealable plastic bag. Squeeze out the excess air. Place this bag inside another plastic bag. Slide the bag into a pillowcase or place a damp towel between your skin and the bag.  Mix 3 parts water with 1 part rubbing alcohol. Freeze the mixture in a sealable plastic bag. When you remove the mixture from the freezer, it will be slushy. Squeeze out the excess air. Place this bag inside another plastic bag. Slide the bag into a pillowcase or place a damp towel between your skin and the bag. SEEK MEDICAL CARE  IF:  You develop white spots on your skin. This may give the skin a blotchy (mottled) appearance.  Your skin turns blue or pale.  Your skin becomes waxy or hard.  Your swelling gets worse. MAKE SURE YOU:   Understand these instructions.  Will watch your condition.  Will get help right away if you are not doing well or get worse.   This information is not intended to replace advice given to you by your health care provider. Make sure you discuss any questions you have with your health care provider.   Document Released: 10/24/2010 Document Revised: 03/20/2014 Document Reviewed: 10/24/2010 Elsevier Interactive Patient Education 2016 [employer] Shoulder Pain The shoulder is the joint that connects your arms to your body. The bones that form the shoulder joint include the upper arm bone (humerus), the shoulder blade (scapula), and the collarbone (clavicle). The top of the humerus is shaped like a ball and fits into a rather flat socket on the scapula (glenoid cavity). A combination of muscles and strong, fibrous tissues that connect muscles to bones (tendons) support your shoulder joint and hold the ball in the socket. Small, fluid-filled sacs (bursae) are located in different areas of the joint. They act as cushions between the bones and the overlying soft tissues and help reduce friction between the gliding tendons and the bone as you move your arm. Your shoulder joint allows a wide range of motion in your arm. This range of motion allows you to do things like scratch your back or throw a ball. However, this range of motion also makes your shoulder more prone to pain from overuse and injury. Causes of shoulder pain can originate from both injury and overuse and usually can be grouped in the following four categories:  Redness, swelling, and pain (inflammation) of the tendon (tendinitis) or the bursae (bursitis).  Instability, such as a dislocation of the joint.  Inflammation of the  joint (arthritis).  Broken bone (fracture). HOME CARE INSTRUCTIONS   Apply ice to the sore area.  Put ice in a plastic bag.  Place a towel between your skin and the bag.  Leave the ice on for 15-20 minutes, 3-4 times per day for the first 2 days, or as directed by your health care provider.  Stop using cold packs if they do not help with the pain.  If you have a shoulder sling or immobilizer, wear it as long as your caregiver instructs. Only remove it to shower or bathe. Move your arm as little as possible, but keep your hand moving to prevent swelling.  Squeeze a soft ball or foam pad as much as possible to help prevent swelling.  Only take over-the-counter or prescription medicines for pain, discomfort, or fever as directed by your caregiver. SEEK MEDICAL CARE IF:   Your shoulder pain increases, or new pain develops in your arm, hand, or fingers.  Your hand or  fingers become cold and numb.  Your pain is not relieved with medicines. SEEK IMMEDIATE MEDICAL CARE IF:   Your arm, hand, or fingers are numb or tingling.  Your arm, hand, or fingers are significantly swollen or turn white or blue. MAKE SURE YOU:   Understand these instructions.  Will watch your condition.  Will get help right away if you are not doing well or get worse.   This information is not intended to replace advice given to you by your health care provider. Make sure you discuss any questions you have with your health care provider.   Document Released: 12/07/2004 Document Revised: 03/20/2014 Document Reviewed: 06/22/2014 Elsevier Interactive Patient Education 2016 [employer] Therapy Heat therapy can help ease sore, stiff, injured, and tight muscles and joints. Heat relaxes your muscles, which may help ease your pain.  RISKS AND COMPLICATIONS If you have any of the following conditions, do not use heat therapy unless your health care provider has approved:  Poor circulation.  Healing wounds  or scarred skin in the area being treated.  Diabetes, heart disease, or high blood pressure.  Not being able to feel (numbness) the area being treated.  Unusual swelling of the area being treated.  Active infections.  Blood clots.  Cancer.  Inability to communicate pain. This may include young children and people who have problems with their brain function (dementia).  Pregnancy. Heat therapy should only be used on old, pre-existing, or long-lasting (chronic) injuries. Do not use heat therapy on new injuries unless directed by your health care provider. HOW TO USE HEAT THERAPY There are several different kinds of heat therapy, including:  Moist heat pack.  Warm water bath.  Hot water bottle.  Electric heating pad.  Heated gel pack.  Heated wrap.  Electric heating pad. Use the heat therapy method suggested by your health care provider. Follow your health care provider's instructions on when and how to use heat therapy. GENERAL HEAT THERAPY RECOMMENDATIONS  Do not sleep while using heat therapy. Only use heat therapy while you are awake.  Your skin may turn pink while using heat therapy. Do not use heat therapy if your skin turns red.  Do not use heat therapy if you have new pain.  High heat or long exposure to heat can cause burns. Be careful when using heat therapy to avoid burning your skin.  Do not use heat therapy on areas of your skin that are already irritated, such as with a rash or sunburn. SEEK MEDICAL CARE IF:  You have blisters, redness, swelling, or numbness.  You have new pain.  Your pain is worse. MAKE SURE YOU:  Understand these instructions.  Will watch your condition.  Will get help right away if you are not doing well or get worse.   This information is not intended to replace advice given to you by your health care provider. Make sure you discuss any questions you have with your health care provider.   Document Released: 05/22/2011  Document Revised: 03/20/2014 Document Reviewed: 04/22/2013 Elsevier Interactive Patient Education 2016 [employer]. Allergic Rhinitis Allergic rhinitis is when the mucous membranes in the nose respond to allergens. Allergens are particles in the air that cause your body to have an allergic reaction. This causes you to release allergic antibodies. Through a chain of events, these eventually cause you to release histamine into the blood stream. Although meant to protect the body, it is this release of histamine that causes your discomfort, such as  frequent sneezing, congestion, and an itchy, runny nose.  CAUSES Seasonal allergic rhinitis (hay fever) is caused by pollen allergens that may come from grasses, trees, and weeds. Year-round allergic rhinitis (perennial allergic rhinitis) is caused by allergens such as house dust mites, pet dander, and mold spores. SYMPTOMS  Nasal stuffiness (congestion).  Itchy, runny nose with sneezing and tearing of the eyes. DIAGNOSIS Your health care provider can help you determine the allergen or allergens that trigger your symptoms. If you and your health care provider are unable to determine the allergen, skin or blood testing may be used. Your health care provider will diagnose your condition after taking your health history and performing a physical exam. Your health care provider may assess you for other related conditions, such as asthma, pink eye, or an ear infection. TREATMENT Allergic rhinitis does not have a cure, but it can be controlled by:  Medicines that block allergy symptoms. These may include allergy shots, nasal sprays, and oral antihistamines.  Avoiding the allergen. Hay fever may often be treated with antihistamines in pill or nasal spray forms. Antihistamines block the effects of histamine. There are over-the-counter medicines that may help with nasal congestion and swelling around the eyes. Check with your health care provider before taking or  giving this medicine. If avoiding the allergen or the medicine prescribed do not work, there are many new medicines your health care provider can prescribe. Stronger medicine may be used if initial measures are ineffective. Desensitizing injections can be used if medicine and avoidance does not work. Desensitization is when a patient is given ongoing shots until the body becomes less sensitive to the allergen. Make sure you follow up with your health care provider if problems continue. HOME CARE INSTRUCTIONS It is not possible to completely avoid allergens, but you can reduce your symptoms by taking steps to limit your exposure to them. It helps to know exactly what you are allergic to so that you can avoid your specific triggers. SEEK MEDICAL CARE IF:  You have a fever.  You develop a cough that does not stop easily (persistent).  You have shortness of breath.  You start wheezing.  Symptoms interfere with normal daily activities.   This information is not intended to replace advice given to you by your health care provider. Make sure you discuss any questions you have with your health care provider.   Document Released: 11/22/2000 Document Revised: 03/20/2014 Document Reviewed: 11/04/2012 Elsevier Interactive Patient Education 2016 [employer] Sinusitis, Adult Sinusitis is redness, soreness, and inflammation of the paranasal sinuses. Paranasal sinuses are air pockets within the bones of your face. They are located beneath your eyes, in the middle of your forehead, and above your eyes. In healthy paranasal sinuses, mucus is able to drain out, and air is able to circulate through them by way of your nose. However, when your paranasal sinuses are inflamed, mucus and air can become trapped. This can allow bacteria and other germs to grow and cause infection. Sinusitis can develop quickly and last only a short time (acute) or continue over a long period (chronic). Sinusitis that lasts for more  than 12 weeks is considered chronic. CAUSES Causes of sinusitis include:  Allergies.  Structural abnormalities, such as displacement of the cartilage that separates your nostrils (deviated septum), which can decrease the air flow through your nose and sinuses and affect sinus drainage.  Functional abnormalities, such as when the small hairs (cilia) that line your sinuses and help remove mucus do not  work properly or are not present. SIGNS AND SYMPTOMS Symptoms of acute and chronic sinusitis are the same. The primary symptoms are pain and pressure around the affected sinuses. Other symptoms include:  Upper toothache.  Earache.  Headache.  Bad breath.  Decreased sense of smell and taste.  A cough, which worsens when you are lying flat.  Fatigue.  Fever.  Thick drainage from your nose, which often is green and may contain pus (purulent).  Swelling and warmth over the affected sinuses. DIAGNOSIS Your health care provider will perform a physical exam. During your exam, your health care provider may perform any of the following to help determine if you have acute sinusitis or chronic sinusitis:  Look in your nose for signs of abnormal growths in your nostrils (nasal polyps).  Tap over the affected sinus to check for signs of infection.  View the inside of your sinuses using an imaging device that has a light attached (endoscope). If your health care provider suspects that you have chronic sinusitis, one or more of the following tests may be recommended:  Allergy tests.  Nasal culture. A sample of mucus is taken from your nose, sent to a lab, and screened for bacteria.  Nasal cytology. A sample of mucus is taken from your nose and examined by your health care provider to determine if your sinusitis is related to an allergy. TREATMENT Most cases of acute sinusitis are related to a viral infection and will resolve on their own within 10 days. Sometimes, medicines are prescribed  to help relieve symptoms of both acute and chronic sinusitis. These may include pain medicines, decongestants, nasal steroid sprays, or saline sprays. However, for sinusitis related to a bacterial infection, your health care provider will prescribe antibiotic medicines. These are medicines that will help kill the bacteria causing the infection. Rarely, sinusitis is caused by a fungal infection. In these cases, your health care provider will prescribe antifungal medicine. For some cases of chronic sinusitis, surgery is needed. Generally, these are cases in which sinusitis recurs more than 3 times per year, despite other treatments. HOME CARE INSTRUCTIONS  Drink plenty of water. Water helps thin the mucus so your sinuses can drain more easily.  Use a humidifier.  Inhale steam 3-4 times a day (for example, sit in the bathroom with the shower running).  Apply a warm, moist washcloth to your face 3-4 times a day, or as directed by your health care provider.  Use saline nasal sprays to help moisten and clean your sinuses.  Take medicines only as directed by your health care provider.  If you were prescribed either an antibiotic or antifungal medicine, finish it all even if you start to feel better. SEEK IMMEDIATE MEDICAL CARE IF:  You have increasing pain or severe headaches.  You have nausea, vomiting, or drowsiness.  You have swelling around your face.  You have vision problems.  You have a stiff neck.  You have difficulty breathing.   This information is not intended to replace advice given to you by your health care provider. Make sure you discuss any questions you have with your health care provider.   Document Released: 02/27/2005 Document Revised: 03/20/2014 Document Reviewed: 03/14/2011 Elsevier Interactive Patient Education 2016 Else[employer] Inc. Otitis Media With Effusion Otitis media with effusion is the presence of fluid in the middle ear. This is a common problem in  children, which often follows ear infections. It may be present for weeks or longer after the infection. Unlike an acute  ear infection, otitis media with effusion refers only to fluid behind the ear drum and not infection. Children with repeated ear and sinus infections and allergy problems are the most likely to get otitis media with effusion. CAUSES  The most frequent cause of the fluid buildup is dysfunction of the eustachian tubes. These are the tubes that drain fluid in the ears to the back of the nose (nasopharynx). SYMPTOMS   The main symptom of this condition is hearing loss. As a result, you or your child may:  Listen to the TV at a loud volume.  Not respond to questions.  Ask "what" often when spoken to.  Mistake or confuse one sound or word for another.  There may be a sensation of fullness or pressure but usually not pain. DIAGNOSIS   Your health care provider will diagnose this condition by examining you or your child's ears.  Your health care provider may test the pressure in you or your child's ear with a tympanometer.  A hearing test may be conducted if the problem persists. TREATMENT   Treatment depends on the duration and the effects of the effusion.  Antibiotics, decongestants, nose drops, and cortisone-type drugs (tablets or nasal spray) may not be helpful.  Children with persistent ear effusions may have delayed language or behavioral problems. Children at risk for developmental delays in hearing, learning, and speech may require referral to a specialist earlier than children not at risk.  You or your child's health care provider may suggest a referral to an ear, nose, and throat surgeon for treatment. The following may help restore normal hearing:  Drainage of fluid.  Placement of ear tubes (tympanostomy tubes).  Removal of adenoids (adenoidectomy). HOME CARE INSTRUCTIONS   Avoid secondhand smoke.  Infants who are breastfed are less likely to have this  condition.  Avoid feeding infants while they are lying flat.  Avoid known environmental allergens.  Avoid people who are sick. SEEK MEDICAL CARE IF:   Hearing is not better in 3 months.  Hearing is worse.  Ear pain.  Drainage from the ear.  Dizziness. MAKE SURE YOU:   Understand these instructions.  Will watch your condition.  Will get help right away if you are not doing well or get worse.   This information is not intended to replace advice given to you by your health care provider. Make sure you discuss any questions you have with your health care provider.   Document Released: 04/06/2004 Document Revised: 03/20/2014 Document Reviewed: 09/24/2012 Elsevier Interactive Patient Education 2016 [employer] Shoulder Range of Motion Exercises Shoulder range of motion (ROM) exercises are designed to keep the shoulder moving freely. They are often recommended for people who have shoulder pain. MOVEMENT EXERCISE When you are able, do this exercise 5-6 days per week, or as told by your health care provider. Work toward doing 2 sets of 10 swings. Pendulum Exercise How To Do This Exercise Lying Down  Lie face-down on a bed with your abdomen close to the side of the bed.  Let your arm hang over the side of the bed.  Relax your shoulder, arm, and hand.  Slowly and gently swing your arm forward and back. Do not use your neck muscles to swing your arm. They should be relaxed. If you are struggling to swing your arm, have someone gently swing it for you. When you do this exercise for the first time, swing your arm at a 15 degree angle for 15 seconds, or swing your  arm 10 times. As pain lessens over time, increase the angle of the swing to 30-45 degrees.  Repeat steps 1-4 with the other arm. How To Do This Exercise While Standing  Stand next to a sturdy chair or table and hold on to it with your hand.  Bend forward at the waist.  Bend your knees slightly.  Relax your other  arm and let it hang limp.  Relax the shoulder blade of the arm that is hanging and let it drop.  While keeping your shoulder relaxed, use body motion to swing your arm in small circles. The first time you do this exercise, swing your arm for about 30 seconds or 10 times. When you do it next time, swing your arm for a little longer.  Stand up tall and relax.  Repeat steps 1-7, this time changing the direction of the circles.  Repeat steps 1-8 with the other arm. STRETCHING EXERCISES Do these exercises 3-4 times per day on 5-6 days per week or as told by your health care provider. Work toward holding the stretch for 20 seconds. Stretching Exercise 1  Lift your arm straight out in front of you.  Bend your arm 90 degrees at the elbow (right angle) so your forearm goes across your body and looks like the letter "L."  Use your other arm to gently pull the elbow forward and across your body.  Repeat steps 1-3 with the other arm. Stretching Exercise 2 You will need a towel or rope for this exercise.  Bend one arm behind your back with the palm facing outward.  Hold a towel with your other hand.  Reach the arm that holds the towel above your head, and bend that arm at the elbow. Your wrist should be behind your neck.  Use your free hand to grab the free end of the towel.  With the higher hand, gently pull the towel up behind you.  With the lower hand, pull the towel down behind you.  Repeat steps 1-6 with the other arm. STRENGTHENING EXERCISES Do each of these exercises at four different times of day (sessions) every day or as told by your health care provider. To begin with, repeat each exercise 5 times (repetitions). Work toward doing 3 sets of 12 repetitions or as told by your health care provider. Strengthening Exercise 1 You will need a light weight for this activity. As you grow stronger, you may use a heavier weight.  Standing with a weight in your hand, lift your arm  straight out to the side until it is at the same height as your shoulder.  Bend your arm at 90 degrees so that your fingers are pointing to the ceiling.  Slowly raise your hand until your arm is straight up in the air.  Repeat steps 1-3 with the other arm. Strengthening Exercise 2 You will need a light weight for this activity. As you grow stronger, you may use a heavier weight.  Standing with a weight in your hand, gradually move your straight arm in an arc, starting at your side, then out in front of you, then straight up over your head.  Gradually move your other arm in an arc, starting at your side, then out in front of you, then straight up over your head.  Repeat steps 1-2 with the other arm. Strengthening Exercise 3 You will need an elastic band for this activity. As you grow stronger, gradually increase the size of the bands or increase the number  of bands that you use at one time. 1. While standing, hold an elastic band in one hand and raise that arm up in the air. 2. With your other hand, pull down the band until that hand is by your side. 3. Repeat steps 1-2 with the other arm.   This information is not intended to replace advice given to you by your health care provider. Make sure you discuss any questions you have with your health care provider.   Document Released: 11/26/2002 Document Revised: 07/14/2014 Document Reviewed: 02/23/2014 Elsevier Interactive Patient Education [employer]2016 Elsevier Inc.

## 2015-06-02 NOTE — ED Notes (Signed)
Right shoulder, collarbone pain for 1 day

## 2016-01-03 ENCOUNTER — Ambulatory Visit (INDEPENDENT_AMBULATORY_CARE_PROVIDER_SITE_OTHER): Payer: Managed Care, Other (non HMO) | Admitting: Urology

## 2016-01-03 ENCOUNTER — Encounter: Payer: Self-pay | Admitting: Urology

## 2016-01-03 VITALS — BP 131/82 | HR 77 | Ht 74.0 in | Wt 259.1 lb

## 2016-01-03 DIAGNOSIS — Z3009 Encounter for other general counseling and advice on contraception: Secondary | ICD-10-CM | POA: Diagnosis not present

## 2016-01-03 MED ORDER — DIAZEPAM 10 MG PO TABS: ORAL_TABLET | ORAL | 0 refills | Status: DC

## 2016-01-03 NOTE — Progress Notes (Signed)
01/03/2016 1:53 PM   Dale Rogers 1985-03-20 [id number]  Referring provider: No referring provider defined for this encounter.  Chief Complaint  Patient presents with  . New Patient (Initial Visit)    Vasectomy Consult    HPI: [name] is a 30 year old African American presents today requesting a vasectomy.  Patient has two children, a son and a daughter, and wishes to end his family unit at this point.  Patient denies any history of chronic prostatitis, epididymitis, orchitis, or other genital pain.  Today, we discussed what the vas deferens is, where it is located, and its function. We reviewed the procedure for vasectomy, it's risks, benefits, alternatives, and likelihood of achieving his goals.   We discussed in detail the procedure, complications, and recovery as well as the need for clearance prior to unprotected intercourse. We discussed that vasectomy does not protect against sexually transmitted diseases. We discussed that this procedure does not result in immediate sterility and that they would need to use other forms of birth control until he has been cleared with a three month negative postvasectomy semen analyses.  I explained that the procedure is considered to be permanent and that attempts at reversal have varying degrees of success. These options include vasectomy reversal, sperm retrieval, and in vitro fertilization; these can be very expensive.   We discussed the chance of postvasectomy pain syndrome which occurs in less than 5% of patients. I explained to the patient that there is no treatment to resolve this chronic pain, and that if it developed I would not be able to help resolve the issue, but that surgery is generally not needed for correction.   I explained there have even been reports of systemic like illness associated with this chronic pain, and that there was no good cure. I explained that vasectomy it is not a 100% reliable form of  birth control, and the risk of pregnancy after vasectomy is approximately 1 in 2000 men who had a negative postvasectomy semen analysis or rare non-motile sperm.  I explained that repeat vasectomy was necessary in less than 1% of vasectomy procedures when employing the type of technique that is performed in the office. I explained that he should refrain from ejaculation for approximately one week following vasectomy. I explained that there are other options for birth control which are permanent and non-permanent; we discussed these.  I explained the rates of surgical complications, such as symptomatic hematoma or infection, are low (1-2%) and vary with the surgeon's experience and criteria used to diagnose the complication.   PMH: Past Medical History:  Diagnosis Date  . Seasonal allergies   . Sinusitis     Surgical History: Past Surgical History:  Procedure Laterality Date  . ACNE CYST REMOVAL     from jaw    Home Medications:    Medication List       Accurate as of 01/03/16  1:53 PM. Always use your most recent med list.          amoxicillin-clavulanate 875-125 MG tablet Commonly known as:  AUGMENTIN Take 1 tablet by mouth every 12 (twelve) hours.   diazepam 10 MG tablet Commonly known as:  VALIUM Take 30 minutes prior to vasectomy   fluticasone 50 MCG/ACT nasal spray Commonly known as:  FLONASE Place 2 sprays into the nose daily.   loratadine 10 MG dissolvable tablet Commonly known as:  CLARITIN REDITABS Take 1 tablet (10 mg total) by mouth daily.  naproxen 500 MG tablet Commonly known as:  NAPROSYN Take 1 tablet (500 mg total) by mouth 2 (two) times daily with a meal.   sodium chloride 0.65 % Soln nasal spray Commonly known as:  OCEAN Place 2 sprays into both nostrils every 2 (two) hours while awake.       Allergies:  Allergies  Allergen Reactions  . Hydrocodone     Family History: Family History  Problem Relation Age of Onset  . Prostate cancer  Maternal Grandfather   . Kidney disease Neg Hx   . Bladder Cancer Neg Hx     Social History:  reports that he has never smoked. He has never used smokeless tobacco. He reports that he drinks alcohol. He reports that he does not use drugs.  ROS: UROLOGY Frequent Urination?: No Hard to postpone urination?: No Burning/pain with urination?: No Get up at night to urinate?: No Leakage of urine?: No Urine stream starts and stops?: No Trouble starting stream?: No Do you have to strain to urinate?: No Blood in urine?: No Urinary tract infection?: No Sexually transmitted disease?: No Injury to kidneys or bladder?: No Painful intercourse?: No Weak stream?: No Erection problems?: No Penile pain?: No  Gastrointestinal Nausea?: No Vomiting?: No Indigestion/heartburn?: No Diarrhea?: No Constipation?: No  Constitutional Fever: No Night sweats?: No Weight loss?: No Fatigue?: No  Skin Skin rash/lesions?: No Itching?: No  Eyes Blurred vision?: No Double vision?: No  Ears/Nose/Throat Sore throat?: No Sinus problems?: No  Hematologic/Lymphatic Swollen glands?: No Easy bruising?: No  Cardiovascular Leg swelling?: No Chest pain?: No  Respiratory Cough?: No Shortness of breath?: No  Endocrine Excessive thirst?: No  Musculoskeletal Back pain?: No Joint pain?: No  Neurological Headaches?: Yes Dizziness?: No  Psychologic Depression?: No Anxiety?: No  Physical Exam: BP 131/82   Pulse 77   Ht 6\' 2"  (1.88 m)   Wt 259 lb 1.6 oz (117.5 kg)   BMI 33.27 kg/m   Constitutional: Well nourished. Alert and oriented, No acute distress. HEENT:  AT, moist mucus membranes. Trachea midline, no masses. Cardiovascular: No clubbing, cyanosis, or edema. Respiratory: Normal respiratory effort, no increased work of breathing. GI: Abdomen is soft, non tender, non distended, no abdominal masses. Liver and spleen not palpable.  No hernias appreciated.  Stool sample for occult  testing is not indicated.   GU: No CVA tenderness.  No bladder fullness or masses.  Patient with circumcised/uncircumcised phallus.   Urethral meatus is patent.  No penile discharge. No penile lesions or rashes. Scrotum without lesions, cysts, rashes and/or edema.  Testicles are located scrotally bilaterally. No masses are appreciated in the testicles. Left and right epididymis are normal. Rectal: Deferred. Skin: No rashes, bruises or suspicious lesions. Lymph: No cervical or inguinal adenopathy. Neurologic: Grossly intact, no focal deficits, moving all 4 extremities. Psychiatric: Normal mood and affect.   Assessment & Plan:    1. Vasectomy consult:  Patient has read and signed the consent.  He is given the pre-op vasectomy instruction sheet.  He is prescribed Valium 10 mg and instructed to take it 30 minutes prior to his vasectomy appointment.  He is to have a driver.  I reemphasized to the patient that this is to be considered a permanent form of birth control, that he is to use an alternative form of birth control until we receive the 3 months specimen and it is cleared of sperm and that this will not prevent STI's.  His questions are answered to his satisfaction and he  understands the risks and is willing to proceed with the vasectomy.  He will schedule his vasectomy.    I spent 30 minutes in a face-to-face conversation concerning the vasectomy procedure and pre-and post op expectations.  Greater than 50% was spent in counseling & coordination of care with the patient.   Return for vasectomy.  These notes generated with voice recognition software. I apologize for typographical errors.  Michiel Cowboy, PA-C  [facility] [address] [place], [place] [address] [phone]

## 2016-01-21 ENCOUNTER — Ambulatory Visit: Payer: Managed Care, Other (non HMO) | Admitting: Urology

## 2016-12-18 ENCOUNTER — Encounter: Payer: Self-pay | Admitting: Emergency Medicine

## 2016-12-18 ENCOUNTER — Emergency Department
Admission: EM | Admit: 2016-12-18 | Discharge: 2016-12-18 | Disposition: A | Discharge status: Home / Self Care | Payer: 59 | Attending: Emergency Medicine | Admitting: Emergency Medicine

## 2016-12-18 ENCOUNTER — Emergency Department: Payer: 59

## 2016-12-18 ENCOUNTER — Telehealth: Payer: Self-pay

## 2016-12-18 ENCOUNTER — Ambulatory Visit (INDEPENDENT_AMBULATORY_CARE_PROVIDER_SITE_OTHER)
Admission: EM | Admit: 2016-12-18 | Discharge: 2016-12-18 | Disposition: A | Discharge status: Hospice | Payer: 59 | Source: Home / Self Care | Attending: Family Medicine | Admitting: Family Medicine

## 2016-12-18 DIAGNOSIS — R079 Chest pain, unspecified: Secondary | ICD-10-CM | POA: Diagnosis not present

## 2016-12-18 DIAGNOSIS — I10 Essential (primary) hypertension: Secondary | ICD-10-CM | POA: Diagnosis not present

## 2016-12-18 DIAGNOSIS — Z79899 Other long term (current) drug therapy: Secondary | ICD-10-CM | POA: Diagnosis not present

## 2016-12-18 DIAGNOSIS — R002 Palpitations: Secondary | ICD-10-CM

## 2016-12-18 DIAGNOSIS — I480 Paroxysmal atrial fibrillation: Secondary | ICD-10-CM | POA: Diagnosis not present

## 2016-12-18 LAB — CBC
HEMATOCRIT: 41.9 % (ref 40.0–52.0)
Hemoglobin: 13.6 g/dL (ref 13.0–18.0)
MCH: 27.8 pg (ref 26.0–34.0)
MCHC: 32.5 g/dL (ref 32.0–36.0)
MCV: 85.4 fL (ref 80.0–100.0)
PLATELETS: 273 10*3/uL (ref 150–440)
RBC: 4.9 MIL/uL (ref 4.40–5.90)
RDW: 13.3 % (ref 11.5–14.5)
WBC: 7.2 10*3/uL (ref 3.8–10.6)

## 2016-12-18 LAB — BASIC METABOLIC PANEL
Anion gap: 8 (ref 5–15)
BUN: 10 mg/dL (ref 6–20)
CHLORIDE: 104 mmol/L (ref 101–111)
CO2: 28 mmol/L (ref 22–32)
Calcium: 9.4 mg/dL (ref 8.9–10.3)
Creatinine, Ser: 0.84 mg/dL (ref 0.61–1.24)
GFR calc non Af Amer: >60 mL/min (ref >60)
Glucose, Bld: 109 mg/dL — ABNORMAL HIGH (ref 65–99)
POTASSIUM: 4.2 mmol/L (ref 3.5–5.1)
SODIUM: 140 mmol/L (ref 135–145)

## 2016-12-18 LAB — TROPONIN I
Troponin I: <0.03 ng/mL (ref <0.03)
Troponin I: <0.03 ng/mL (ref <0.03)

## 2016-12-18 NOTE — Telephone Encounter (Signed)
Patient calling to schedule ed [facility] for chest pain abnrmal ekg    Scheduled next available 12/10  Patient wants asap as he was told he needed to be seen asap by ed   Please advise

## 2016-12-18 NOTE — ED Provider Notes (Signed)
[facility]    CSN: [id number] Arrival date & time: 12/18/16  0816     History   Chief Complaint Chief Complaint  Patient presents with  . Chest Pain  . Hypertension    HPI Dale Rogers is a 31 y.o. male.   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 He had no associated nausea vomiting or diaphoresis. He states that he took his blood pressure with a wrist blood pressure cuff and his pressure was elevated at 148/114. He became alarmed and decided to come here. He states he last night he felt fine as he did early this morning when he first awoke but then had the sudden onset of the symptoms as described above. Since that time he said he's felt a fluttering type feeling in his chest and heaviness that he explained earlier. He has no previous history of heart disease. He has no medical issues.        Past Medical History:  Diagnosis Date  . Seasonal allergies   . Sinusitis     There are no active problems to display for this patient.   Past Surgical History:  Procedure Laterality Date  . ACNE CYST REMOVAL     from jaw       Home Medications    Prior to Admission medications   Medication Sig Start Date End Date Taking? Authorizing Provider  diazepam (VALIUM) 10 MG tablet Take 30 minutes prior to vasectomy 01/03/16   Michiel Cowboy A, PA-C  fluticasone (FLONASE) 50 MCG/ACT nasal spray Place 2 sprays into the nose daily. 08/31/11 08/30/12  Domenick Gong, MD  loratadine (CLARITIN REDITABS) 10 MG dissolvable tablet Take 1 tablet (10 mg total) by mouth daily. 06/02/15   Betancourt, Jarold Song, NP  naproxen (NAPROSYN) 500 MG tablet Take 1 tablet (500 mg total) by mouth 2 (two) times daily with a meal. Patient not taking: Reported on 01/03/2016  06/02/15   Barbaraann Barthel, NP  sodium chloride (OCEAN) 0.65 % SOLN nasal spray Place 2 sprays into both nostrils every 2 (two) hours while awake. 06/02/15 07/03/15  Betancourt, Jarold Song, NP    Family History Family History  Problem Relation Age of Onset  . Prostate cancer Maternal Grandfather   . Kidney disease Neg Hx   . Bladder Cancer Neg Hx     Social History Social History  Substance Use Topics  . Smoking status: Never Smoker  . Smokeless tobacco: Never Used  . Alcohol use Yes     Comment: occasionally     Allergies   Hydrocodone   Review of Systems Review of Systems  Constitutional: Positive for activity change. Negative for appetite change, chills, diaphoresis, fatigue and fever.  Cardiovascular: Positive for chest pain and palpitations.  All other systems reviewed and are negative.    Physical Exam Triage Vital Signs ED Triage Vitals  Enc Vitals Group     BP 12/18/16 0830 133/80     Pulse Rate 12/18/16 0830 78     Resp 12/18/16 0830 18     Temp 12/18/16 0830 97.7 F (36.5 C)     Temp Source 12/18/16 0830 Oral     SpO2 12/18/16 0830 100 %     Weight 12/18/16 0828 260 lb (117.9 kg)     Height 12/18/16  9604  (1.88 m)     Head Circumference --      Peak Flow --      Pain Score 12/18/16 0828 1     Pain Loc --      Pain Edu? --      Excl. in GC? --    No data found.   Updated Vital Signs BP 133/80 (BP Location: Left Arm)   Pulse 78   Temp 97.7 F (36.5 C) (Oral)   Resp 18   Ht  (1.88 m)   Wt 260 lb (117.9 kg)   SpO2 100%   BMI 33.38 kg/m   Visual Acuity Right Eye Distance:   Left Eye Distance:   Bilateral Distance:    Right Eye Near:   Left Eye Near:    Bilateral Near:     Physical Exam  Constitutional: He is oriented to person, place, and time. He appears well-developed and well-nourished. No distress.  HENT:  Head: Normocephalic.  Eyes: Pupils are equal, round, and reactive to light.  Neck: Normal range of motion.    Cardiovascular: Normal rate and normal heart sounds.  Exam reveals no gallop and no friction rub.   No murmur heard. Normal S1 and S2. he has an occasional irregular rythm  Pulmonary/Chest: Effort normal and breath sounds normal.  Abdominal: Soft. Bowel sounds are normal.  Musculoskeletal: Normal range of motion.  Neurological: He is alert and oriented to person, place, and time.  Skin: Skin is warm and dry. He is not diaphoretic.  Psychiatric: He has a normal mood and affect. His behavior is normal. Judgment and thought content normal.  Nursing note and vitals reviewed.    UC Treatments / Results  Labs (all labs ordered are listed, but only abnormal results are displayed) Labs Reviewed - No data to display  EKG ED ECG REPORT   Date: 12/18/2016  EKG Time: 9:28 AM  Rate70  Rhythm: atrial fibrillation,  Axis: normal Intervals:nonspecific intraventricular conduction delay  ST&T Change:No, acute  Narrative Interpretation: Atrial fibrillation with controlled ventricular response and nonspecific intra- ventricular conduction defect.            Radiology No results found.  Procedures Procedures (including critical care time)  Medications Ordered in UC Medications - No data to display   Initial Impression / Assessment and Plan / UC Course  I have reviewed the triage vital signs and the nursing notes.  Pertinent labs & imaging results that were available during my care of the patient were reviewed by me and considered in my medical decision making (see chart for details).     Discussed in detail with the patient and his wife. I have recommended that they proceed to [facility] emergency department for further evaluation. He is stable at this point in time. He will be transported by his wife in a privately own vehicle. Tammy Sours, charge nurse was alerted of the patient's condition and arrival.  Final Clinical Impressions(s) / UC Diagnoses   Final  diagnoses:  Paroxysmal atrial fibrillation [facility])    New Prescriptions Discharge Medication List as of 12/18/2016  9:12 AM       Controlled Substance Prescriptions [place] Controlled Substance Registry consulted? Not Applicable   Lutricia Feil, PA-C 12/18/16 1010

## 2016-12-18 NOTE — Discharge Instructions (Signed)
please follow-up with cardiology. They should be calling you at home to arrange a follow-up appointment, if you do not hear from them in the next 24 hours please call the number provided to arrange this appointment. Please return to the emergency department for any further chest pain, any significant palpitations/skipping/irregular heartbeat, shortness of breath or any other symptom personally concerning to yourself.

## 2016-12-18 NOTE — ED Triage Notes (Signed)
Pt reports chest pressure and palpations this am.

## 2016-12-18 NOTE — ED Provider Notes (Signed)
[facility] Emergency Department Provider Note  Time seen: 12:18 PM  I have reviewed the triage vital signs and the nursing notes.   HISTORY  Chief Complaint Chest Pain    HPI Dale Rogers is a 31 y.o. male With no past medical history presents to the emergency department for chest discomfort and palpitations. According to the patient around 4:00 this morning he woke to get ready for work, shortly after began having some chest pressure and palpitations. Patient took his blood pressure which was elevated, continued to have palpitations while at work so he went to urgent care for evaluation. Urgent care he had an EKG suspicious for atrial fibrillation was referred to the emergency department for further evaluation. Upon arrival here the patient appears well, denies any symptoms. Denies any chest pain or palpitations currently. Denies any cardiac history. Denies any shortness of breath nausea or diaphoresis at any time.  Past Medical History:  Diagnosis Date  . Seasonal allergies   . Sinusitis     There are no active problems to display for this patient.   Past Surgical History:  Procedure Laterality Date  . ACNE CYST REMOVAL     from jaw    Prior to Admission medications   Medication Sig Start Date End Date Taking? Authorizing Provider  diazepam (VALIUM) 10 MG tablet Take 30 minutes prior to vasectomy Patient not taking: Reported on 12/18/2016 01/03/16   Michiel Cowboy A, PA-C  fluticasone (FLONASE) 50 MCG/ACT nasal spray Place 2 sprays into the nose daily. 08/31/11 08/30/12  Domenick Gong, MD  loratadine (CLARITIN REDITABS) 10 MG dissolvable tablet Take 1 tablet (10 mg total) by mouth daily. 06/02/15   Betancourt, Jarold Song, NP  naproxen (NAPROSYN) 500 MG tablet Take 1 tablet (500 mg total) by mouth 2 (two) times daily with a meal. Patient not taking: Reported on 01/03/2016 06/02/15   Barbaraann Barthel, NP  sodium chloride (OCEAN) 0.65 % SOLN nasal  spray Place 2 sprays into both nostrils every 2 (two) hours while awake. Patient not taking: Reported on 12/18/2016 06/02/15 07/03/15  Barbaraann Barthel, NP    Allergies  Allergen Reactions  . Hydrocodone Rash    Family History  Problem Relation Age of Onset  . Prostate cancer Maternal Grandfather   . Kidney disease Neg Hx   . Bladder Cancer Neg Hx     Social History Social History  Substance Use Topics  . Smoking status: Never Smoker  . Smokeless tobacco: Never Used  . Alcohol use Yes     Comment: occasionally    Review of Systems Constitutional: Negative for fever. Cardiovascular: chest pressure this morning, now resolved. Palpitations this morning, now resolved Respiratory: Negative for shortness of breath. Gastrointestinal: Negative for abdominal pain, nausea or vomiting Neurological: Negative for headache All other ROS negative  ____________________________________________   PHYSICAL EXAM:  VITAL SIGNS: ED Triage Vitals  Enc Vitals Group     BP 12/18/16 0952 (!) 145/74     Pulse Rate 12/18/16 0952 66     Resp 12/18/16 0952 18     Temp 12/18/16 0952 97.8 F (36.6 C)     Temp Source 12/18/16 0952 Oral     SpO2 12/18/16 0952 100 %     Weight --      Height --      Head Circumference --      Peak Flow --      Pain Score 12/18/16 1000 4     Pain Loc --  Pain Edu? --      Excl. in GC? --     Constitutional: Alert and oriented. Well appearing and in no distress. Eyes: Normal exam ENT   Head: Normocephalic and atraumatic   Mouth/Throat: Mucous membranes are moist. Cardiovascular: Normal rate, regular rhythm. No murmur Respiratory: Normal respiratory effort without tachypnea nor retractions. Breath sounds are clear Gastrointestinal: Soft and nontender. No distention.  Musculoskeletal: Nontender with normal range of motion in all extremities. No lower extremity tenderness or edema. Neurologic:  Normal speech and language. No gross focal neurologic  deficits  Skin:  Skin is warm, dry and intact.  Psychiatric: Mood and affect are normal.   ____________________________________________    EKG  EKG reviewed and interpreted by myself shows normal sinus rhythm at 68 bpm, narrow QRS, normal axis, normal intervals, no concerning ST changes.  ____________________________________________    RADIOLOGY  chest x-ray appears normal  ____________________________________________   INITIAL IMPRESSION / ASSESSMENT AND PLAN / ED COURSE  Pertinent labs & imaging results that were available during my care of the patient were reviewed by me and considered in my medical decision making (see chart for details).  patient presents to the emergency department for chest discomfort and palpitations referred from urgent care. Differential this time would include ACS, palpitations, PVCs, other cardiac arrhythmia, age of fibrillation. Patient's labs are largely within normal limits including a negative troponin and normal white blood cell count. Chest x-ray is negative. EKG appears normal in the emergency department. I reviewed the patient's EKG as well as rhythm strip from urgent care. It is not entirely clear what rhythm the patient was in whether it was a junctional rhythm, did not appear to be true atrial fibrillation. I discussed this with the cardiologist Dr. Kirke Corin, who also reviewed the EKGs. We will repeat a troponin, as long as the repeat troponin is normal patient will be discharged with outpatient follow-up with cardiology for a Holter monitor.  I discussed this with the patient is agreeable to plan. Remained symptom-free in the emergency department.  patient's repeat troponin is negative. We will discharge home with cardiology follow-up. Patient agreeable to plan.  ____________________________________________   FINAL CLINICAL IMPRESSION(S) / ED DIAGNOSES  palpitations  Chest Pain    Minna Antis, MD 12/18/16 1310

## 2016-12-18 NOTE — ED Triage Notes (Signed)
Patient complains of chest discomfort upon wakening this morning around 430am. Patient states that he was able to feel his heart beating harder and noticed that it radiated through to his back. Patient states that he took BP and noticed that it was elevated (148/114). Patient states that pain has subsided some now, and notices it when up and moving around.

## 2016-12-19 ENCOUNTER — Other Ambulatory Visit: Payer: Self-pay

## 2016-12-19 ENCOUNTER — Telehealth: Payer: Self-pay | Admitting: Cardiovascular Disease

## 2016-12-19 DIAGNOSIS — R079 Chest pain, unspecified: Secondary | ICD-10-CM

## 2016-12-19 DIAGNOSIS — R002 Palpitations: Secondary | ICD-10-CM

## 2016-12-19 NOTE — Telephone Encounter (Signed)
Per Dr. Kirke Corin: "Referred from the ED for chest pain and palpitations. Schedule 48-hour Holter monitor and a follow-up within one week"

## 2016-12-20 ENCOUNTER — Ambulatory Visit (INDEPENDENT_AMBULATORY_CARE_PROVIDER_SITE_OTHER): Payer: 59

## 2016-12-20 DIAGNOSIS — R002 Palpitations: Secondary | ICD-10-CM

## 2016-12-20 DIAGNOSIS — R079 Chest pain, unspecified: Secondary | ICD-10-CM | POA: Diagnosis not present

## 2016-12-25 ENCOUNTER — Ambulatory Visit: Payer: 59 | Admitting: Cardiovascular Disease

## 2016-12-25 ENCOUNTER — Ambulatory Visit
Admission: RE | Admit: 2016-12-25 | Discharge: 2016-12-25 | Disposition: A | Discharge status: Home / Self Care | Payer: 59 | Source: Ambulatory Visit | Attending: Cardiovascular Disease | Admitting: Cardiovascular Disease

## 2016-12-25 DIAGNOSIS — R002 Palpitations: Secondary | ICD-10-CM | POA: Diagnosis not present

## 2016-12-25 DIAGNOSIS — R079 Chest pain, unspecified: Secondary | ICD-10-CM | POA: Diagnosis not present

## 2016-12-25 DIAGNOSIS — I491 Atrial premature depolarization: Secondary | ICD-10-CM | POA: Diagnosis not present

## 2016-12-25 DIAGNOSIS — I493 Ventricular premature depolarization: Secondary | ICD-10-CM | POA: Diagnosis not present

## 2017-01-07 NOTE — Progress Notes (Signed)
Cardiology Office Note  Date:  01/08/2017   ID:  Dale LentBrandon K Rogers, DOB [birth year], MRN [id number]  PCP:  Patient, No Pcp Per   Chief Complaint  Patient presents with  . other    Follow up from [facility]; chest pain and abnormal EKG. Meds reviewed by the pt. verbally. "doing well."     HPI:  Dale Rogers is a 31 y.o. male With no prior cardiac history Who presents by referral from the emergency room for abnormal EKG  He reports that he woke up December 18, 2016 at 5 AM to help get his kids ready for school He had some strong palpitations, slight short of breath, checked his blood pressure and this was elevated  And was told he had atrial fibrillation, told to go to the emergency room In the emergency room several more EKGs and was monitored for several hours Troponin negative x2 Base metabolic panel normal, CBC normal Symptoms resolved, he felt back to his normal state and went home With no further episodes since that time With no prior episodes of palpitations Beats were not painful, described them as different, "weird" Denied any change in his diet, no drugs, no herbal or vitamin supplements Denies excessive stressors  EKG is reviewed in detail with him showing normal sinus rhythm, Select EKGs with junctional escape rhythm No atrial fibrillation needed  EKG personally reviewed by myself on todays visit Shows normal sinus rhythm with rate 71 bpm no significant ST or T wave changes,   PMH:   has a past medical history of Seasonal allergies and Sinusitis.  PSH:    Past Surgical History:  Procedure Laterality Date  . ACNE CYST REMOVAL     from jaw    Current Outpatient Prescriptions  Medication Sig Dispense Refill  . fluticasone (FLONASE) 50 MCG/ACT nasal spray Place 2 sprays into the nose daily. 16 g 0  . loratadine (CLARITIN REDITABS) 10 MG dissolvable tablet Take 1 tablet (10 mg total) by mouth daily. 30 tablet 0   No current facility-administered medications  for this visit.      Allergies:   Hydrocodone   Social History:  The patient  reports that he has never smoked. He has never used smokeless tobacco. He reports that he drinks alcohol. He reports that he does not use drugs.   Family History:   family history includes Prostate cancer in his maternal grandfather.    Review of Systems: Review of Systems  Constitutional: Negative.   Respiratory: Negative.   Cardiovascular: Positive for palpitations.  Gastrointestinal: Negative.   Musculoskeletal: Negative.   Neurological: Negative.   Psychiatric/Behavioral: Negative.   All other systems reviewed and are negative.    PHYSICAL EXAM: VS:  BP 136/78 (BP Location: Left Arm, Patient Position: Sitting, Cuff Size: Normal)   Pulse 71   Ht 6\' 2"  (1.88 m)   Wt 266 lb 8 oz (120.9 kg)   BMI 34.22 kg/m  , BMI Body mass index is 34.22 kg/m. GEN: Well nourished, well developed, in no acute distress  HEENT: normal  Neck: no JVD, carotid bruits, or masses Cardiac: RRR; no murmurs, rubs, or gallops,no edema  Respiratory:  clear to auscultation bilaterally, normal work of breathing GI: soft, nontender, nondistended, + BS MS: no deformity or atrophy  Skin: warm and dry, no rash Neuro:  Strength and sensation are intact Psych: euthymic mood, full affect    Recent Labs: 12/18/2016: BUN 10; Creatinine, Ser 0.84; Hemoglobin 13.6; Platelets 273; Potassium 4.2; Sodium  140    Lipid Panel No results found for: CHOL, HDL, LDLCALC, TRIG    Wt Readings from Last 3 Encounters:  01/08/17 266 lb 8 oz (120.9 kg)  12/18/16 260 lb (117.9 kg)  01/03/16 259 lb 1.6 oz (117.5 kg)       ASSESSMENT AND PLAN:  Junctional escape rhythm - Plan: EKG 12-Lead EKGs from the emergency room reviewed December 18, 2016 showing normal sinus rhythm, periods of sinus rhythm with junctional escape, narrow complex He was symptomatic, Now reports symptoms have resolved, no further problems Long discussion with him  concerning his arrhythmia, no prior history Recommended we monitor him for now EKG is normal, normal clinical exam, no significant risk factors for ischemia Recommended he call our office if he has any further episodes  Palpitations As above, sinus rhythm with junctional escape on EKG No atrial fibrillation noted   Disposition:   F/U as needed   Total encounter time more than 60 minutes  Greater than 50% was spent in counseling and coordination of care with the patient    Orders Placed This Encounter  Procedures  . EKG 12-Lead     Signed, Dossie Arbour, M.D., Ph.D. 01/08/2017  [facility] [place], [place] [phone]

## 2017-01-08 ENCOUNTER — Ambulatory Visit (INDEPENDENT_AMBULATORY_CARE_PROVIDER_SITE_OTHER): Payer: 59 | Admitting: Cardiovascular Disease

## 2017-01-08 ENCOUNTER — Encounter: Payer: Self-pay | Admitting: Cardiovascular Disease

## 2017-01-08 VITALS — BP 136/78 | HR 71 | Ht 74.0 in | Wt 266.5 lb

## 2017-01-08 DIAGNOSIS — I4949 Other premature depolarization: Secondary | ICD-10-CM | POA: Diagnosis not present

## 2017-01-08 DIAGNOSIS — R002 Palpitations: Secondary | ICD-10-CM

## 2017-01-08 NOTE — Patient Instructions (Signed)
Medication Instructions:   No medication changes made  Labwork:  No new labs needed  Testing/Procedures:  No further testing at this time  Please call or write if you get cholesterol numbers    Follow-Up: It was a pleasure seeing you in the office today. Please call us if you have new issues that need to be addressed before your next appt.  [phone]  Your physician wants you to follow-up in:  As needed  If you need a refill on your cardiac medications before your next appointment, please call your pharmacy.

## 2017-02-19 ENCOUNTER — Ambulatory Visit: Payer: 59 | Admitting: Cardiovascular Disease

## 2018-04-05 IMAGING — CR DG CHEST 2V
1 series · 2 of 2 positions shown · non-contrast
Comparison: None in PACs

CLINICAL DATA: Intermittent chest pressure sensation began this
morning, possible cardiac dysrhythmia. No known cardiopulmonary
disease. Nonsmoker.

EXAM:
CHEST  2 VIEW

[Series 1: dg chest 2 view · 0.14mm/px · 2 of 2 slices shown]
[im 1/2]
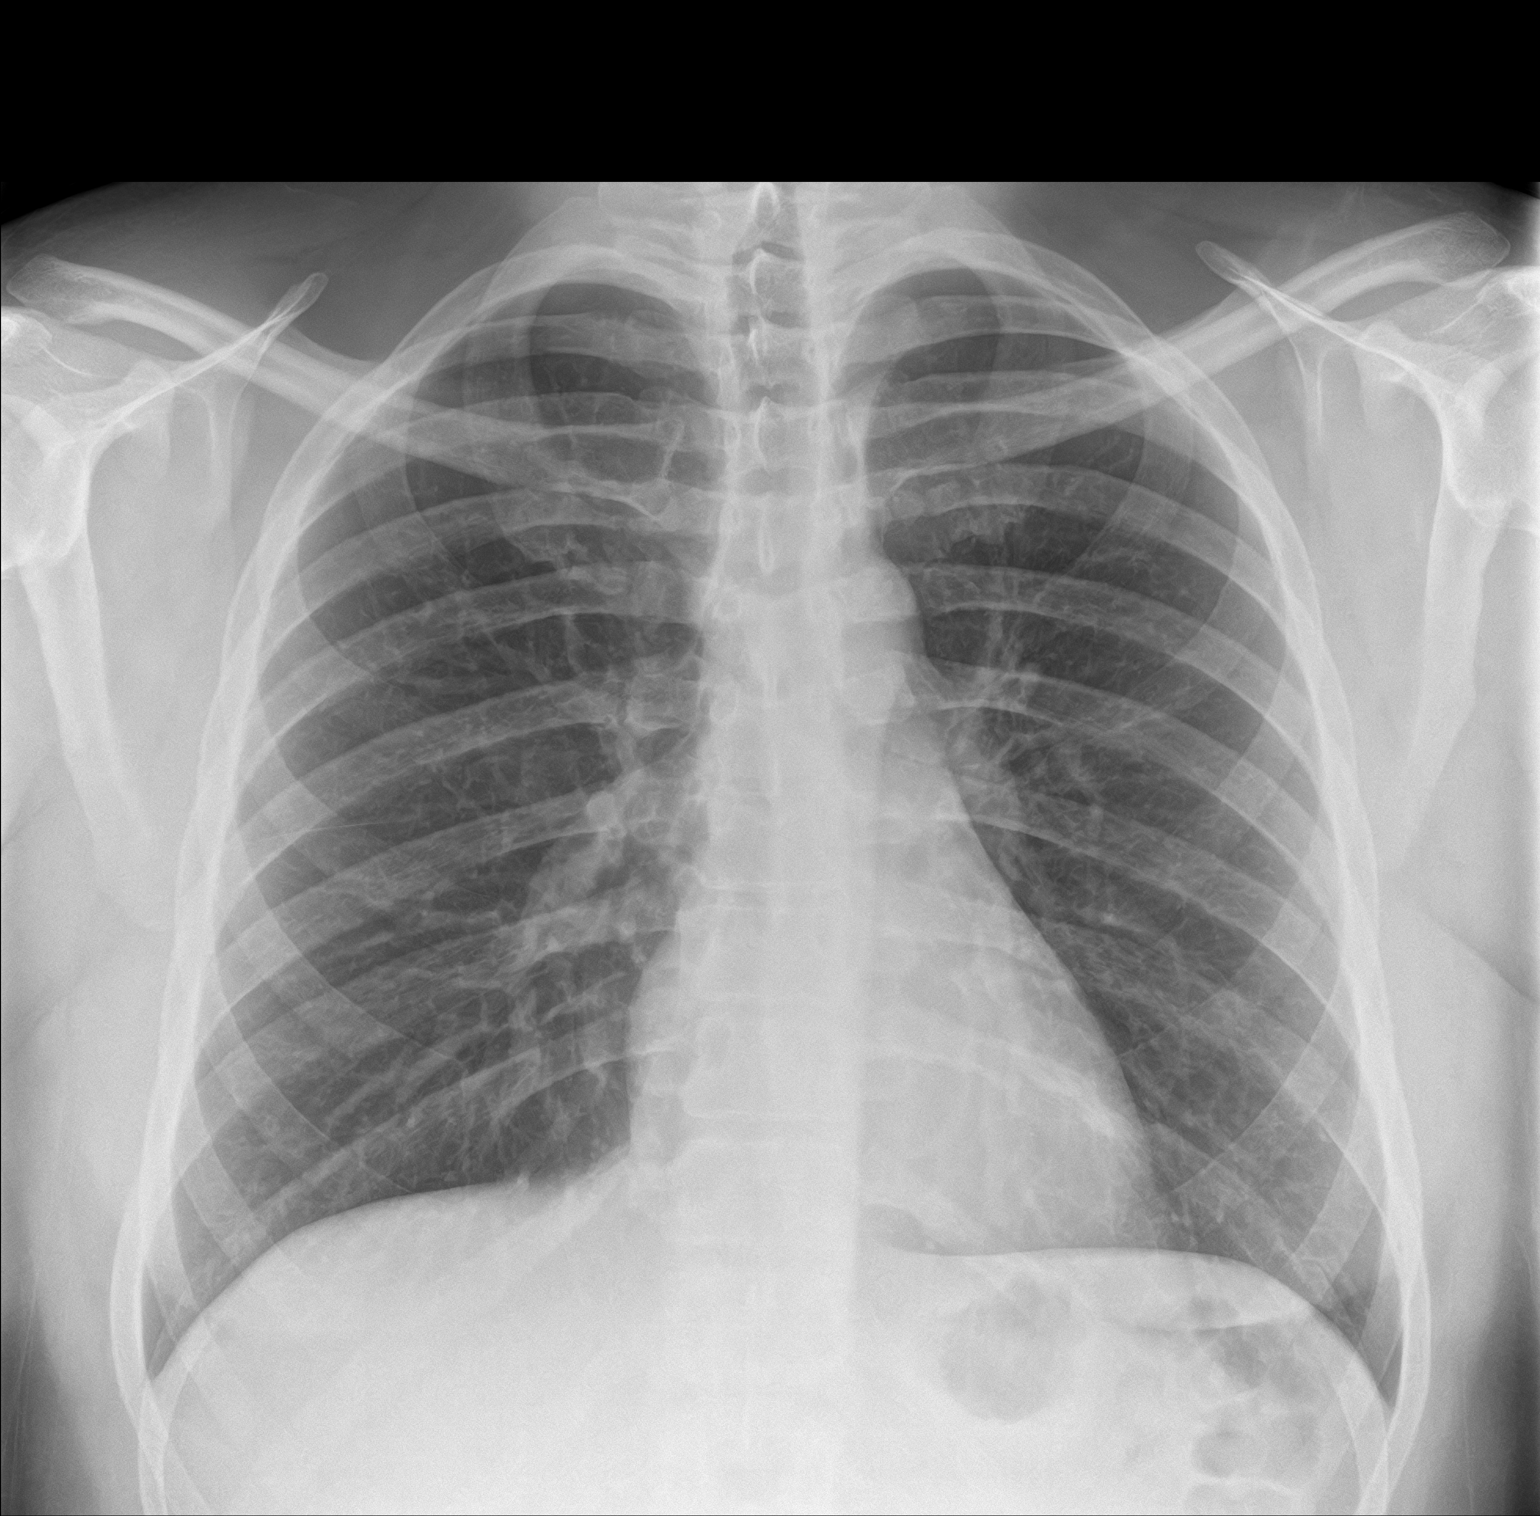
[im 2/2]
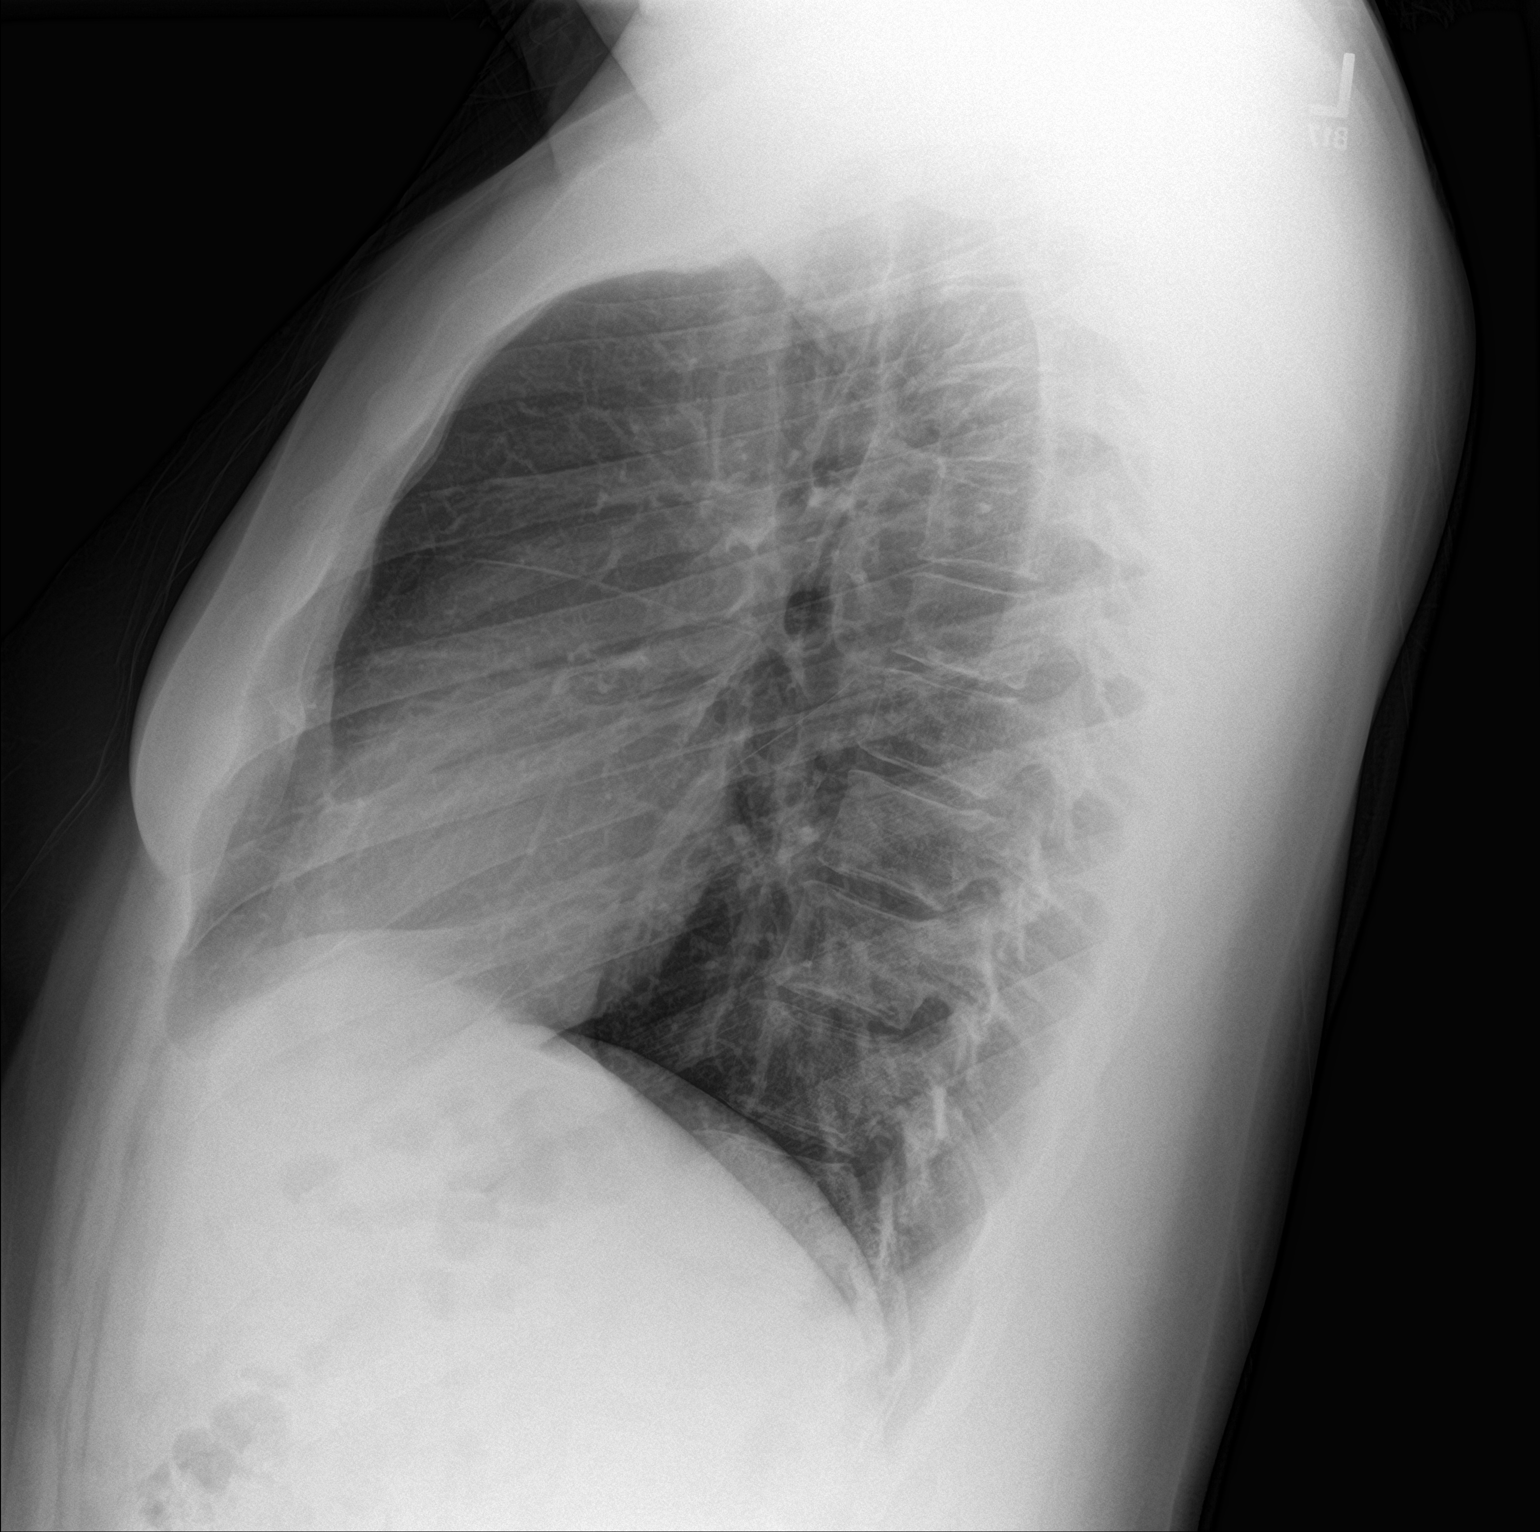

[2 of 2 positions shown; findings below may reference images not displayed]

FINDINGS: The lungs are adequately inflated and clear. The heart and pulmonary
vascularity are normal. The mediastinum is normal in width. There is
no pleural effusion. The bony thorax exhibits no acute abnormality.
IMPRESSION: There is no active cardiopulmonary disease.

## 2019-06-24 ENCOUNTER — Ambulatory Visit: Payer: 59 | Admitting: Family Medicine

## 2019-08-21 ENCOUNTER — Ambulatory Visit: Payer: 59 | Admitting: Family Medicine

## 2021-01-22 ENCOUNTER — Ambulatory Visit
Admission: EM | Admit: 2021-01-22 | Discharge: 2021-01-22 | Disposition: A | Discharge status: Home / Self Care | Payer: 59 | Attending: Emergency Medicine | Admitting: Emergency Medicine

## 2021-01-22 ENCOUNTER — Other Ambulatory Visit: Payer: Self-pay

## 2021-01-22 DIAGNOSIS — J111 Influenza due to unidentified influenza virus with other respiratory manifestations: Secondary | ICD-10-CM | POA: Diagnosis not present

## 2021-01-22 DIAGNOSIS — R051 Acute cough: Secondary | ICD-10-CM

## 2021-01-22 DIAGNOSIS — R509 Fever, unspecified: Secondary | ICD-10-CM

## 2021-01-22 DIAGNOSIS — Z20822 Contact with and (suspected) exposure to covid-19: Secondary | ICD-10-CM | POA: Diagnosis not present

## 2021-01-22 LAB — RESP PANEL BY RT-PCR (FLU A&B, COVID) ARPGX2
Influenza A by PCR: POSITIVE — AB
Influenza B by PCR: NEGATIVE
SARS Coronavirus 2 by RT PCR: NEGATIVE

## 2021-01-22 MED ORDER — OSELTAMIVIR PHOSPHATE 75 MG PO CAPS: 75.0000 mg | ORAL_CAPSULE | Freq: Two times a day (BID) | ORAL | 0 refills | Status: AC

## 2021-01-22 NOTE — ED Triage Notes (Addendum)
Pt with chills, generalized body aches, headache, scratchy throat, fever. Daughter had FLU A last week.

## 2021-01-22 NOTE — Discharge Instructions (Signed)
-  I will call with your results but I am sure you will be positive for the flu.  Once a result I will call you and let you know where I have sent the Tamiflu.  As we discussed I we will have to send it differently to him. - Take over-the-counter cough medication. - Increase rest and fluids. - Tylenol or Motrin as needed for fever control. - As we discussed you will need to stay out of work until you have been fever free for greater than 24 hours without Tylenol or Motrin. - Go to emergency department for any uncontrollable fever, weakness or breathing problem.

## 2021-01-22 NOTE — ED Provider Notes (Signed)
[facility]    CSN: [id number] Arrival date & time: 01/22/21  1346      History   Chief Complaint Chief Complaint  Patient presents with   Generalized Body Aches   Chills    HPI Dale Rogers is a 35 y.o. male presenting for onset of fever up to 103 degrees, fatigue, body aches, headache, scratchy throat, and mild cough yesterday.  Patient also reports some congestion.  He says that his daughter had influenza A last week.  No known COVID exposure.  Patient has been taking antipyretics for fever and over-the-counter cough syrup.  Denies any breathing difficulty, vomiting or diarrhea.  Patient is otherwise healthy.  No other complaints today.  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 Authorizing Provider  oseltamivir (TAMIFLU) 75 MG capsule Take 1 capsule (75 mg total) by mouth every 12 (twelve) hours for 5 days. 01/22/21 01/27/21 Yes Eusebio Friendly B, PA-C  fluticasone (FLONASE) 50 MCG/ACT nasal spray Place 2 sprays into the nose daily. 08/31/11 01/08/17  Domenick Gong, MD  loratadine (CLARITIN REDITABS) 10 MG dissolvable tablet Take 1 tablet (10 mg total) by mouth daily. 06/02/15   Betancourt, Jarold Song, NP    Family History Family History  Problem Relation Age of Onset   Prostate cancer Maternal Grandfather    Kidney disease Neg Hx    Bladder Cancer Neg Hx     Social History Social History   Tobacco Use   Smoking status: Never   Smokeless tobacco: Never  Vaping Use   Vaping Use: Never used  Substance Use Topics   Alcohol use: Yes    Comment: occasionally   Drug use: No     Allergies    Hydrocodone   Review of Systems Review of Systems  Constitutional:  Positive for fatigue and fever.  HENT:  Positive for congestion, rhinorrhea and sore throat. Negative for sinus pressure and sinus pain.   Respiratory:  Positive for cough. Negative for shortness of breath.   Cardiovascular:  Negative for chest pain.  Gastrointestinal:  Negative for abdominal pain, diarrhea, nausea and vomiting.  Musculoskeletal:  Positive for myalgias.  Neurological:  Positive for headaches. Negative for weakness and light-headedness.  Hematological:  Negative for adenopathy.    Physical Exam Triage Vital Signs ED Triage Vitals  Enc Vitals Group     BP 01/22/21 1458 138/86     Pulse Rate 01/22/21 1458 (!) 105     Resp 01/22/21 1458 19     Temp 01/22/21 1458 99.4 F (37.4 C)     Temp Source 01/22/21 1458 Oral     SpO2 01/22/21 1458 98 %     Weight 01/22/21 1457 265 lb (120.2 kg)     Height 01/22/21 1457 6\' 2"  (1.88 m)     Head Circumference --      Peak Flow --      Pain Score 01/22/21 1457 6     Pain Loc --      Pain Edu? --      Excl. in GC? --  No data found.  Updated Vital Signs BP 138/86 (BP Location: Left Arm)   Pulse (!) 105   Temp 99.4 F (37.4 C) (Oral)   Resp 19   Ht 6\' 2"  (1.88 m)   Wt 265 lb (120.2 kg)   SpO2 98%   BMI 34.02 kg/m      Physical Exam Vitals and nursing note reviewed.  Constitutional:      General: He is not in acute distress.    Appearance: Normal appearance. He is well-developed. He is ill-appearing.  HENT:     Head: Normocephalic and atraumatic.     Nose: Congestion and rhinorrhea present.     Mouth/Throat:     Mouth: Mucous membranes are moist.     Pharynx: Oropharynx is clear. Posterior oropharyngeal erythema present.  Eyes:     General: No scleral icterus.    Conjunctiva/sclera: Conjunctivae normal.  Cardiovascular:     Rate and Rhythm: Regular rhythm. Tachycardia present.     Heart sounds: Normal heart sounds.  Pulmonary:      Effort: Pulmonary effort is normal. No respiratory distress.     Breath sounds: Normal breath sounds.  Musculoskeletal:     Cervical back: Neck supple.  Skin:    General: Skin is warm and dry.  Neurological:     General: No focal deficit present.     Mental Status: He is alert. Mental status is at baseline.     Motor: No weakness.     Coordination: Coordination normal.     Gait: Gait normal.  Psychiatric:        Mood and Affect: Mood normal.        Behavior: Behavior normal.        Thought Content: Thought content normal.     UC Treatments / Results  Labs (all labs ordered are listed, but only abnormal results are displayed) Labs Reviewed  RESP PANEL BY RT-PCR (FLU A&B, COVID) ARPGX2 - Abnormal; Notable for the following components:      Result Value   Influenza A by PCR POSITIVE (*)    All other components within normal limits    EKG   Radiology No results found.  Procedures Procedures (including critical care time)  Medications Ordered in UC Medications - No data to display  Initial Impression / Assessment and Plan / UC Course  I have reviewed the triage vital signs and the nursing notes.  Pertinent labs & imaging results that were available during my care of the patient were reviewed by me and considered in my medical decision making (see chart for details).   35 y/o male presenting for onset of fever, fatigue, cough, congestion, and sore throat yesterday.  Daughter had influenza A last week.  Temp is currently 99.4 but he has been taking Tylenol and Motrin.  Pulse elevated at 105 bpm.  Patient is ill-appearing but nontoxic.  Nasal congestion and posterior pharyngeal erythema on exam.  Chest clear to auscultation.  Respiratory panel obtained today. +flu A.  Sent Tamiflu.  Advised already care.  Reviewed return and ED precautions.  Work note given.   Final Clinical Impressions(s) / UC Diagnoses   Final diagnoses:  Influenza  Fever, unspecified  Acute cough      Discharge Instructions      -I will call with your results but I am sure you will be positive for the flu.  Once a result I will call you and let you know where I have sent the Tamiflu.  As  we discussed I we will have to send it differently to him. - Take over-the-counter cough medication. - Increase rest and fluids. - Tylenol or Motrin as needed for fever control. - As we discussed you will need to stay out of work until you have been fever free for greater than 24 hours without Tylenol or Motrin. - Go to emergency department for any uncontrollable fever, weakness or breathing problem.     ED Prescriptions     Medication Sig Dispense Auth. Provider   oseltamivir (TAMIFLU) 75 MG capsule Take 1 capsule (75 mg total) by mouth every 12 (twelve) hours for 5 days. 10 capsule Shirlee Latch, PA-C      PDMP not reviewed this encounter.   Shirlee Latch, PA-C 01/22/21 1610

## 2021-02-17 ENCOUNTER — Encounter: Payer: Self-pay | Admitting: Emergency Medicine

## 2021-02-17 ENCOUNTER — Ambulatory Visit
Admission: EM | Admit: 2021-02-17 | Discharge: 2021-02-17 | Disposition: A | Discharge status: Home / Self Care | Payer: 59 | Attending: Emergency Medicine | Admitting: Emergency Medicine

## 2021-02-17 DIAGNOSIS — B349 Viral infection, unspecified: Secondary | ICD-10-CM | POA: Diagnosis not present

## 2021-02-17 NOTE — Discharge Instructions (Addendum)
Your COVID and Flu tests are pending.  You should self quarantine until the test results are back.    Take Tylenol or ibuprofen as needed for fever or discomfort.  Rest and keep yourself hydrated.    Follow-up with your primary care provider if your symptoms are not improving.     

## 2021-02-17 NOTE — ED Triage Notes (Signed)
Pt c/o cough, ST, runny nose, chills, and fever symptoms started yesterday.

## 2021-02-17 NOTE — ED Provider Notes (Signed)
Dale Rogers    CSN: [id number] Arrival date & time: 02/17/21  1507      History   Chief Complaint Chief Complaint  Patient presents with   Sore Throat   Headache   Chills   Fever    HPI Dale Rogers is a 35 y.o. male.  Patient presents with 1 day history of low-grade fever, chills, congestion, runny nose, sore throat, cough.  He denies rash, shortness of breath, vomiting, diarrhea, or other symptoms.  OTC cold medication taken at home.  Patient was seen at [facility] urgent care on 01/22/2021; diagnosed with influenza; treated with Tamiflu.  The history is provided by the patient and medical records.   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 Authorizing Provider  fluticasone (FLONASE) 50 MCG/ACT nasal spray Place 2 sprays into the nose daily. 08/31/11 01/08/17  Domenick Gong, MD  loratadine (CLARITIN REDITABS) 10 MG dissolvable tablet Take 1 tablet (10 mg total) by mouth daily. 06/02/15   Betancourt, Jarold Song, NP    Family History Family History  Problem Relation Age of Onset   Prostate cancer Maternal Grandfather    Kidney disease Neg Hx    Bladder Cancer Neg Hx     Social History Social History   Tobacco Use   Smoking status: Never   Smokeless tobacco: Never  Vaping Use   Vaping Use: Never used  Substance Use Topics   Alcohol use: Yes    Comment: occasionally   Drug use: No     Allergies   Hydrocodone   Review of Systems Review of Systems  Constitutional:  Positive for chills and fever.  HENT:  Positive for congestion, rhinorrhea and sore throat. Negative for ear pain.   Respiratory:   Positive for cough. Negative for shortness of breath.   Cardiovascular:  Negative for chest pain and palpitations.  Gastrointestinal:  Negative for diarrhea and vomiting.  Skin:  Negative for color change and rash.  All other systems reviewed and are negative.   Physical Exam Triage Vital Signs ED Triage Vitals  Enc Vitals Group     BP      Pulse      Resp      Temp      Temp src      SpO2      Weight      Height      Head Circumference      Peak Flow      Pain Score      Pain Loc      Pain Edu?      Excl. in GC?    No data found.  Updated Vital Signs BP 128/83 (BP Location: Left Arm)   Pulse (!) 111   Temp 98.7 F (37.1 C) (Oral)   Resp 18   SpO2 96%   Visual Acuity Right Eye Distance:   Left Eye Distance:   Bilateral Distance:    Right Eye Near:   Left Eye Near:    Bilateral Near:     Physical Exam Vitals and  nursing note reviewed.  Constitutional:      General: He is not in acute distress.    Appearance: He is well-developed. He is not ill-appearing.  HENT:     Right Ear: Tympanic membrane normal.     Left Ear: Tympanic membrane normal.     Nose: Nose normal.     Mouth/Throat:     Mouth: Mucous membranes are moist.     Pharynx: Oropharynx is clear.  Cardiovascular:     Rate and Rhythm: Normal rate and regular rhythm.     Heart sounds: Normal heart sounds.  Pulmonary:     Effort: Pulmonary effort is normal. No respiratory distress.     Breath sounds: Normal breath sounds.  Musculoskeletal:     Cervical back: Neck supple.  Skin:    General: Skin is warm and dry.  Neurological:     Mental Status: He is alert.  Psychiatric:        Mood and Affect: Mood normal.        Behavior: Behavior normal.     UC Treatments / Results  Labs (all labs ordered are listed, but only abnormal results are displayed) Labs Reviewed  COVID-19, FLU A+B NAA    EKG   Radiology No results found.  Procedures Procedures (including critical care  time)  Medications Ordered in UC Medications - No data to display  Initial Impression / Assessment and Plan / UC Course  I have reviewed the triage vital signs and the nursing notes.  Pertinent labs & imaging results that were available during my care of the patient were reviewed by me and considered in my medical decision making (see chart for details).   Viral illness.  COVID and Flu pending.  Instructed patient to self quarantine per CDC guidelines.  Discussed symptomatic treatment including Tylenol or ibuprofen, rest, hydration.  Instructed patient to follow up with PCP if symptoms are not improving.  Patient agrees to plan of care.     Final Clinical Impressions(s) / UC Diagnoses   Final diagnoses:  Viral illness     Discharge Instructions      Your COVID and Flu tests are pending.  You should self quarantine until the test results are back.    Take Tylenol or ibuprofen as needed for fever or discomfort.  Rest and keep yourself hydrated.    Follow-up with your primary care provider if your symptoms are not improving.         ED Prescriptions   None    PDMP not reviewed this encounter.   Mickie Bail, NP 02/17/21 [phone]

## 2021-02-18 LAB — COVID-19, FLU A+B NAA
Influenza A, NAA: NOT DETECTED
Influenza B, NAA: NOT DETECTED
SARS-CoV-2, NAA: DETECTED — AB
